# Patient Record
Sex: Female | Born: 1980 | Race: Black or African American | Hispanic: No | Marital: Single | Smoking: Former smoker
Health system: Southern US, Community
[De-identification: ages and names within clinical notes are randomized; demographics above are authoritative.]

## PROBLEM LIST (undated history)

## (undated) ENCOUNTER — Inpatient Hospital Stay (HOSPITAL_COMMUNITY): Payer: Self-pay

## (undated) DIAGNOSIS — R0602 Shortness of breath: Secondary | ICD-10-CM

## (undated) DIAGNOSIS — R7303 Prediabetes: Secondary | ICD-10-CM

## (undated) DIAGNOSIS — F419 Anxiety disorder, unspecified: Secondary | ICD-10-CM

## (undated) DIAGNOSIS — E669 Obesity, unspecified: Secondary | ICD-10-CM

## (undated) DIAGNOSIS — D649 Anemia, unspecified: Secondary | ICD-10-CM

## (undated) DIAGNOSIS — Z789 Other specified health status: Secondary | ICD-10-CM

## (undated) DIAGNOSIS — I10 Essential (primary) hypertension: Secondary | ICD-10-CM

## (undated) DIAGNOSIS — M1990 Unspecified osteoarthritis, unspecified site: Secondary | ICD-10-CM

## (undated) DIAGNOSIS — R600 Localized edema: Secondary | ICD-10-CM

## (undated) DIAGNOSIS — F32A Depression, unspecified: Secondary | ICD-10-CM

## (undated) DIAGNOSIS — G4730 Sleep apnea, unspecified: Secondary | ICD-10-CM

## (undated) DIAGNOSIS — R0600 Dyspnea, unspecified: Secondary | ICD-10-CM

## (undated) HISTORY — DX: Prediabetes: R73.03

## (undated) HISTORY — DX: Sleep apnea, unspecified: G47.30

## (undated) HISTORY — DX: Dyspnea, unspecified: R06.00

## (undated) HISTORY — DX: Anemia, unspecified: D64.9

## (undated) HISTORY — DX: Unspecified osteoarthritis, unspecified site: M19.90

## (undated) HISTORY — DX: Shortness of breath: R06.02

## (undated) HISTORY — DX: Localized edema: R60.0

## (undated) HISTORY — DX: Anxiety disorder, unspecified: F41.9

## (undated) HISTORY — DX: Depression, unspecified: F32.A

## (undated) HISTORY — DX: Obesity, unspecified: E66.9

---
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
## 2000-01-16 ENCOUNTER — Inpatient Hospital Stay (HOSPITAL_COMMUNITY): Admission: AD | Admit: 2000-01-16 | Discharge: 2000-01-16 | Payer: Self-pay | Admitting: *Deleted

## 2000-03-22 ENCOUNTER — Inpatient Hospital Stay (HOSPITAL_COMMUNITY): Admission: AD | Admit: 2000-03-22 | Discharge: 2000-03-22 | Payer: Self-pay | Admitting: Obstetrics & Gynecology

## 2000-03-23 ENCOUNTER — Inpatient Hospital Stay (HOSPITAL_COMMUNITY): Admission: AD | Admit: 2000-03-23 | Discharge: 2000-03-28 | Payer: Self-pay | Admitting: Obstetrics & Gynecology

## 2000-03-23 ENCOUNTER — Inpatient Hospital Stay (HOSPITAL_COMMUNITY): Admission: AD | Admit: 2000-03-23 | Discharge: 2000-03-23 | Payer: Self-pay | Admitting: *Deleted

## 2001-04-16 ENCOUNTER — Emergency Department (HOSPITAL_COMMUNITY): Admission: EM | Admit: 2001-04-16 | Discharge: 2001-04-16 | Payer: Self-pay

## 2001-04-24 ENCOUNTER — Encounter: Payer: Self-pay | Admitting: Emergency Medicine

## 2001-04-24 ENCOUNTER — Emergency Department (HOSPITAL_COMMUNITY): Admission: EM | Admit: 2001-04-24 | Discharge: 2001-04-24 | Payer: Self-pay | Admitting: Emergency Medicine

## 2001-07-27 ENCOUNTER — Emergency Department (HOSPITAL_COMMUNITY): Admission: EM | Admit: 2001-07-27 | Discharge: 2001-07-27 | Payer: Self-pay | Admitting: Emergency Medicine

## 2004-06-30 ENCOUNTER — Ambulatory Visit (HOSPITAL_COMMUNITY): Admission: RE | Admit: 2004-06-30 | Discharge: 2004-06-30 | Payer: Self-pay | Admitting: *Deleted

## 2004-11-15 ENCOUNTER — Encounter (INDEPENDENT_AMBULATORY_CARE_PROVIDER_SITE_OTHER): Payer: Self-pay | Admitting: *Deleted

## 2004-11-15 ENCOUNTER — Ambulatory Visit: Payer: Self-pay | Admitting: Obstetrics and Gynecology

## 2004-11-15 ENCOUNTER — Inpatient Hospital Stay (HOSPITAL_COMMUNITY): Admission: AD | Admit: 2004-11-15 | Discharge: 2004-11-18 | Payer: Self-pay | Admitting: *Deleted

## 2004-11-19 ENCOUNTER — Ambulatory Visit: Admission: RE | Admit: 2004-11-19 | Discharge: 2004-11-19 | Payer: Self-pay | Admitting: *Deleted

## 2007-01-17 ENCOUNTER — Emergency Department (HOSPITAL_COMMUNITY): Admission: EM | Admit: 2007-01-17 | Discharge: 2007-01-17 | Payer: Self-pay | Admitting: Emergency Medicine

## 2009-07-06 ENCOUNTER — Emergency Department (HOSPITAL_COMMUNITY): Admission: EM | Admit: 2009-07-06 | Discharge: 2009-07-06 | Payer: Self-pay | Admitting: Family Medicine

## 2010-10-14 NOTE — Discharge Summary (Signed)
Jill Stevenson, Jill Stevenson                ACCOUNT NO.:  [id number]   MEDICAL RECORD NO.:  [id number]          PATIENT TYPE:  INP   LOCATION:  9135                          FACILITY:  [facility]   PHYSICIAN:  Bonnita Hollow, M.D.DATE OF BIRTH:  1980-08-07   DATE OF ADMISSION:  11/15/2004  DATE OF DISCHARGE:  11/18/2004                                 DISCHARGE SUMMARY   ADMISSION DIAGNOSES:  1.  G3, P23-64-64-48 30 year old female with abdominal pain secondary to      contractions.  2.  History of C section x1.   DISCHARGE DIAGNOSES:  A 30 year old G3, P2-0-1-2.   DISCHARGE MEDICATIONS:  1.  Motrin 600 mg q.6-8h. p.r.n. pain.  2.  Percocet 5/325 mg 1-2 tablets q.6h. p.r.n. pain.  3.  Colace 100 mg p.o. daily.  4.  Prenatal vitamins one tablet p.o. daily.  5.  Depo-Provera 150 mg IM x1 before discharge.   HOSPITAL COURSE:  Ms. Bacha is a 30 year old G3, P1-0-1-1 who presented to  the MAU on November 15, 2004, having uterine contractions regularly with  moderate abdominal pain associated.  The patient requested repeat cesarean  section.  The patient was taken back to OR for low transverse cesarean  section.  Infant suction with __________ suction.  Placenta manually  removed.  Estimated blood loss 500 ml.  Postoperative condition  satisfactory.  Patient did receive final for anesthesia.  Baby and infant  stable postoperatively.  Rest of patient's hospital admission was  uneventful.  The patient remained afebrile.  Vital signs stable.  Pain  controlled well with Motrin and Percocet p.r.n.  Patient tolerated diet.  Good bowel sounds.  On discharge date, the patient was sent home in stable  condition.   DISCHARGE LABORATORIES:  Hemoglobin 11.8, blood type A positive, rubella  immune.   FOLLOWUP:  The patient will follow up at [facility] six weeks after  delivery date.        VRE/MEDQ  D:  11/18/2004  T:  11/18/2004  Job:  [id number]

## 2010-10-14 NOTE — Op Note (Signed)
NAMECARLEY, Jill Stevenson                ACCOUNT NO.:  [id number]   MEDICAL RECORD NO.:  [id number]          PATIENT TYPE:  INP   LOCATION:  9135                          FACILITY:  [facility]   PHYSICIAN:  Phil D. Okey Dupre, M.D.     DATE OF BIRTH:  August 09, 1980   DATE OF PROCEDURE:  11/15/2004  DATE OF DISCHARGE:                                 OPERATIVE REPORT   PROCEDURE:  Low transverse cesarean section.   PREOPERATIVE DIAGNOSIS:  Repeat cesarean section and labor.   POSTOPERATIVE DIAGNOSIS:  Repeat cesarean section and labor.   SURGEON:  Dr. Okey Dupre   FIRST ASSISTANT:  Dr. Corky Sox   ESTIMATED BLOOD LOSS:  500 mL.   ANESTHESIA:  Spinal.   POSTOPERATIVE CONDITION:  Satisfactory.   SPECIMENS TO PATHOLOGY:  Placenta.   OPERATIVE FINDINGS:  Baby girl with cord around the shoulder and leg and a  moderate amount of meconium in the amnionic sac.   Procedure went as follows:  Under satisfactory spinal anesthesia, the  patient in dorsal supine position, Foley catheter in urinary bladder, the  abdomen was prepped and draped in the usual sterile manner.  The abdomen was  entered through a Pfannenstiel incision, going through previous surgical  scar that was situated just above the crease in the abdominal apron, i.e.,  about 5 cm above the symphysis pubis.  The abdomen was entered by layers,  entering into the peritoneal cavity, into the visceral peritoneum, and the  anterior surface of the uterus opened transversely to the bladder pushed  away below uterine segment, was entered by sharp and blunt dissection and  from an LOT presentation, the baby was easily delivered.  After the head was  delivered, it was suctioned with DeLee suction.  Minimal amount of return  was obtained, cord doubly clamped, divided, baby handed to pediatrician.  Samples of blood taken from the cord for analysis.  Placenta manually  removed, the uterus explored and closed with a continuous running locked 0  Vicryl in an  atraumatic needle.  Three figure-of-eight sutures were placed  along the suture line in areas where there was still some oozing.  The areas  were observed for bleeding.  None was noted.  The ovaries and tubes were  found to be within normal limits.  The area was irrigated.  Tape,  instrument, sponge, and needle count were reported correct at this time, and  the fascia was closed with continuous running 0 Vicryl in an atraumatic  needle.  Subcutaneous bleeding was controlled with hot cautery.  The skin  edge was approximated with  skin staples, and tape, instrument, sponge, and needle count were reported  once again as correct, and the patient was in satisfactory condition,  transferred to the recovery room with a Foley catheter draining blood-  stained urine at the end of the procedure.       PDR/MEDQ  D:  11/15/2004  T:  11/15/2004  Job:  [id number]

## 2010-10-14 NOTE — Discharge Summary (Signed)
[facility]  Patient:    Jill Stevenson, Jill Stevenson                       MRN: [id number] Adm. Date:  [id number] Disc. Date: [id number] Attending:  Michaelle Copas Dictator:   Andrey Spearman, M.D.                           Discharge Summary  DISCHARGE DIAGNOSES:          1. Status post low transverse cesarean section                                  for failure to descend.                               2. Endometritis.  DISCHARGE MEDICATIONS:        1. Augmentin 875 mg p.o. b.i.d. x 6 days.                               2. Tylox one to two q.4-6h. p.r.n.                               3. Ibuprofen 600 mg q.6h. p.r.n.                               4. Prenatal vitamins one p.o. q.d.                               5. Ortho-Cyclen one p.o. q.d. beginning on                                  April 08, 2000.  BRIEF ADMISSION HISTORY AND PHYSICAL:                 This patient is a 30 year old, G1, P1-0-0-1, admitted at 40-0/7 weeks in active labor with a cervical exam at 100% and 0 station with a bulging bag of water.  The patients prenatal labs were significant for group B streptococcus.  The patients blood type is A+. Antibody negative.  She is rubella immune.  Hepatitis B negative.  The patients contractions on admission were approximately every two to four minutes.  The babys heart rate was in the 130s with positive acceleration. She was then admitted in active labor.  HOSPITAL COURSE: #1 - STATUS POST LOW TRANSVERSE CESAREAN SECTION:  The patient was admitted to labor and delivery.  She went into AROM with thick meconium fluid.  An IUPC and FFC were placed with amnioinfusion started.  At that time, the patient had a maternal temperature to 103 degrees and the fetal heart rates were in the 160s to 170s.  The patient was started on ampicillin at that time for the fever and she was given Tylenol.  The patient continued in active labor and eventually was  found to undergo failure to descend.  The patients station never progressed beyond approximately 0 station.  Therefore, she was taken for a low  transverse cesarean section.  For full dictation, please see the operative note.  A viable female was delivered with Apgars of 8 and 9.  The cord pH was 7.26.  Postoperatively, the patient underwent routine postoperative care.  Her diet was advanced.  Upon discharge, she was ambulating well, and tolerating a full diet.  The staples were removed.  The incision was clean, dry, and intact.  The fundus was firm.  She was given postoperative incisional instructions.  #2 - STATUS POST ENDOMETRITIS:  The patient received ampicillin as stated in active labor.  However, on March 25, 2000, the patient was found to have a temperature of 100.9 degrees.  The patient had a temperature maximum of 101.2 degrees on March 27, 2010.  Therefore, the patient received Unasyn from March 25, 2000, until March 28, 2000, for suspected endometritis. The patient did have associated abdominal pain during this time, which improved throughout the hospitalization, although on discharge she still was slightly tender to palpation over the fundus.  The patient was afebrile for 36 hours prior to discharge.  She will be discharged home with Augmentin 875 mg b.i.d. to take for six days.  She was instructed that if she has recurrence of fevers that she needs to come back to the maternity admissions unit as soon as possible.  She was discharged home in stable condition. DD:  03/28/00 TD:  03/28/00 Job: [id number]

## 2010-10-14 NOTE — Op Note (Signed)
[facility]  Patient:    Jill Stevenson, Jill Stevenson                       MRN: [id number] Proc. Date: 03/24/00 Adm. Date:  [id number] Attending:  Antionette Char                           Operative Report  PREOPERATIVE DIAGNOSIS:       Arrest of active phase of labor with failure                               to descend.  POSTOPERATIVE DIAGNOSIS:      Arrest of active phase of labor with failure                               to descend.  OPERATION:                    Low transverse cesarean section.  OPERATOR:                     Conni Elliot, M.D., Kevin Fenton, M.D.  FINDINGS:                     An 8 pound 10 ounce female, with Apgars of 8 and 9, with cord pH of 7.26.  DESCRIPTION OF PROCEDURE:     Under the appropriate conditions, the lumbar epidural anesthetic, the operative level, with the patient supine in the left lateral tilt position receiving oxygen, the abdomen was prepped and draped in a sterile fashion.  A low transverse Pfannenstiel incision was made, and the incision made through the skin and subcutaneous fascia.  The rectus muscle separated in the midline.  The peritoneal cavity was entered.  A low transverse uterine incision was made basilar from vertex presentation.  The baby was DeLee suctioned prior to the delivery of the chest.  The remainder of the body was delivered.  The cord was doubly clamped and cut, and the baby handed to the neonatologist in attendance.  The placenta was delivered spontaneously.  The uterus, bladder flap, anterior peritoneum, fascia, subcutaneous tissue, and skin were closed in the usual fashion.  The estimated blood loss was approximately 800 cc.  The instrument counts were correct. DD:  03/24/00 TD:  03/24/00 Job: [id number]

## 2012-06-19 ENCOUNTER — Emergency Department (HOSPITAL_COMMUNITY)
Admission: EM | Admit: 2012-06-19 | Discharge: 2012-06-19 | Disposition: A | Discharge status: Home / Self Care | Payer: Self-pay | Attending: Emergency Medicine | Admitting: Emergency Medicine

## 2012-06-19 ENCOUNTER — Encounter (HOSPITAL_COMMUNITY): Payer: Self-pay | Admitting: Emergency Medicine

## 2012-06-19 DIAGNOSIS — S0180XA Unspecified open wound of other part of head, initial encounter: Secondary | ICD-10-CM | POA: Insufficient documentation

## 2012-06-19 DIAGNOSIS — IMO0002 Reserved for concepts with insufficient information to code with codable children: Secondary | ICD-10-CM | POA: Insufficient documentation

## 2012-06-19 DIAGNOSIS — T07XXXA Unspecified multiple injuries, initial encounter: Secondary | ICD-10-CM

## 2012-06-19 DIAGNOSIS — Z23 Encounter for immunization: Secondary | ICD-10-CM | POA: Insufficient documentation

## 2012-06-19 MED ORDER — OXYCODONE-ACETAMINOPHEN 5-325 MG PO TABS
1.0000 | ORAL_TABLET | Freq: Once | ORAL | Status: AC
  Administered 2012-06-19: 1 via ORAL
  Filled 2012-06-19: qty 1

## 2012-06-19 MED ORDER — IBUPROFEN 400 MG PO TABS
800.0000 mg | ORAL_TABLET | Freq: Once | ORAL | Status: AC
  Administered 2012-06-19: 800 mg via ORAL
  Filled 2012-06-19: qty 2

## 2012-06-19 MED ORDER — PERCOCET 5-325 MG PO TABS: 1.0000 | ORAL_TABLET | Freq: Four times a day (QID) | ORAL | Status: DC | PRN

## 2012-06-19 MED ORDER — IBUPROFEN 800 MG PO TABS: 800.0000 mg | ORAL_TABLET | Freq: Three times a day (TID) | ORAL | Status: DC

## 2012-06-19 MED ORDER — TETANUS-DIPHTH-ACELL PERTUSSIS 5-2.5-18.5 LF-MCG/0.5 IM SUSP
0.5000 mL | Freq: Once | INTRAMUSCULAR | Status: AC
  Administered 2012-06-19: 0.5 mL via INTRAMUSCULAR
  Filled 2012-06-19: qty 0.5

## 2012-06-19 NOTE — ED Provider Notes (Signed)
Medical screening examination/treatment/procedure(s) were performed by non-physician practitioner and as supervising physician I was immediately available for consultation/collaboration.  Juliet Rude. Rubin Payor, MD 06/19/12 1534
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
## 2012-06-19 NOTE — ED Notes (Signed)
No swelling at site of lac. Pt able to open and close jaw without difficulty.

## 2014-06-24 ENCOUNTER — Emergency Department (HOSPITAL_COMMUNITY)
Admission: EM | Admit: 2014-06-24 | Discharge: 2014-06-24 | Disposition: A | Discharge status: Home / Self Care | Payer: Medicaid Other | Attending: Emergency Medicine | Admitting: Emergency Medicine

## 2014-06-24 ENCOUNTER — Encounter (HOSPITAL_COMMUNITY): Payer: Self-pay | Admitting: Emergency Medicine

## 2014-06-24 ENCOUNTER — Emergency Department (HOSPITAL_COMMUNITY): Payer: Medicaid Other

## 2014-06-24 DIAGNOSIS — Y998 Other external cause status: Secondary | ICD-10-CM | POA: Diagnosis not present

## 2014-06-24 DIAGNOSIS — S6992XA Unspecified injury of left wrist, hand and finger(s), initial encounter: Secondary | ICD-10-CM

## 2014-06-24 DIAGNOSIS — Y9389 Activity, other specified: Secondary | ICD-10-CM | POA: Insufficient documentation

## 2014-06-24 DIAGNOSIS — S61213A Laceration without foreign body of left middle finger without damage to nail, initial encounter: Secondary | ICD-10-CM | POA: Insufficient documentation

## 2014-06-24 DIAGNOSIS — S61215A Laceration without foreign body of left ring finger without damage to nail, initial encounter: Secondary | ICD-10-CM | POA: Insufficient documentation

## 2014-06-24 DIAGNOSIS — Y288XXA Contact with other sharp object, undetermined intent, initial encounter: Secondary | ICD-10-CM | POA: Diagnosis not present

## 2014-06-24 DIAGNOSIS — S62663A Nondisplaced fracture of distal phalanx of left middle finger, initial encounter for closed fracture: Secondary | ICD-10-CM | POA: Insufficient documentation

## 2014-06-24 DIAGNOSIS — S61211A Laceration without foreign body of left index finger without damage to nail, initial encounter: Secondary | ICD-10-CM | POA: Diagnosis not present

## 2014-06-24 DIAGNOSIS — Y9289 Other specified places as the place of occurrence of the external cause: Secondary | ICD-10-CM | POA: Diagnosis not present

## 2014-06-24 DIAGNOSIS — T07XXXA Unspecified multiple injuries, initial encounter: Secondary | ICD-10-CM

## 2014-06-24 DIAGNOSIS — S62609B Fracture of unspecified phalanx of unspecified finger, initial encounter for open fracture: Secondary | ICD-10-CM

## 2014-06-24 DIAGNOSIS — Z23 Encounter for immunization: Secondary | ICD-10-CM | POA: Diagnosis not present

## 2014-06-24 MED ORDER — CEPHALEXIN 500 MG PO CAPS: 500.0000 mg | ORAL_CAPSULE | Freq: Four times a day (QID) | ORAL | Status: DC

## 2014-06-24 MED ORDER — HYDROCODONE-ACETAMINOPHEN 5-325 MG PO TABS: 1.0000 | ORAL_TABLET | ORAL | Status: DC | PRN

## 2014-06-24 MED ORDER — HYDROCODONE-ACETAMINOPHEN 5-325 MG PO TABS
2.0000 | ORAL_TABLET | Freq: Once | ORAL | Status: AC
  Administered 2014-06-24: 2 via ORAL
  Filled 2014-06-24: qty 2

## 2014-06-24 MED ORDER — LIDOCAINE HCL (PF) 1 % IJ SOLN
5.0000 mL | Freq: Once | INTRAMUSCULAR | Status: AC
  Administered 2014-06-24: 5 mL
  Filled 2014-06-24: qty 5

## 2014-06-24 MED ORDER — TETANUS-DIPHTH-ACELL PERTUSSIS 5-2.5-18.5 LF-MCG/0.5 IM SUSP
0.5000 mL | Freq: Once | INTRAMUSCULAR | Status: AC
  Administered 2014-06-24: 0.5 mL via INTRAMUSCULAR
  Filled 2014-06-24: qty 0.5

## 2014-06-24 NOTE — ED Notes (Signed)
Ortho at bedside to place splint.

## 2014-06-24 NOTE — Discharge Instructions (Signed)
Take the prescribed medication as directed. Follow-up with hand specialist, Dr. Caralyn Guile-- call to schedule appt. Follow-up for suture removal in 5-7 days. Return to the ED for new or worsening symptoms.
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
## 2014-06-24 NOTE — ED Notes (Signed)
Patient states that she was cutting onions at home this morning and cut across her fingers.   Patient has lacerations across L hand fingers minus the thumb.  Bleeding controlled at this time.   Patient has limited movement in fingers.   Good cap refill at this time.

## 2014-06-24 NOTE — ED Notes (Signed)
Ortho tech in to apply splint.

## 2014-06-24 NOTE — Progress Notes (Signed)
Orthopedic Tech Progress Note Patient Details:  Jill Stevenson [birth year] [id number]  Ortho Devices Type of Ortho Device: Ace wrap, Volar splint Ortho Device/Splint Location: lue Ortho Device/Splint Interventions: Application   Jill Stevenson 06/24/2014, 1:51 PM

## 2014-10-14 ENCOUNTER — Ambulatory Visit: Payer: Medicaid Other | Attending: Orthopedic Surgery | Admitting: Occupational Therapy

## 2014-10-14 DIAGNOSIS — M25642 Stiffness of left hand, not elsewhere classified: Secondary | ICD-10-CM | POA: Insufficient documentation

## 2014-10-14 DIAGNOSIS — R609 Edema, unspecified: Secondary | ICD-10-CM | POA: Insufficient documentation

## 2014-10-14 DIAGNOSIS — R29898 Other symptoms and signs involving the musculoskeletal system: Secondary | ICD-10-CM

## 2014-10-14 DIAGNOSIS — M79642 Pain in left hand: Secondary | ICD-10-CM | POA: Insufficient documentation

## 2014-10-14 DIAGNOSIS — M6289 Other specified disorders of muscle: Secondary | ICD-10-CM | POA: Insufficient documentation

## 2014-10-14 NOTE — Therapy (Signed)
[facility] [address] [place], [place], [address] Phone: [phone]   Fax:  [phone]  Occupational Therapy Evaluation  Patient Details  Name: Jill Stevenson MRN: [id number] Date of Birth: [birth year] Referring Provider:  Iran Planas, MD  Encounter Date: 10/14/2014    No past medical history on file.  No past surgical history on file.  There were no vitals filed for this visit.  Visit Diagnosis:  Pain in joint, hand, left  Stiffness of hand joint, left  Edema  Weakness of left hand      Subjective Assessment - 10/14/14 1407    Currently in Pain? Yes   Pain Score 8    Pain Location Finger (Comment which one)   Pain Orientation Left   Pain Descriptors / Indicators Aching;Tingling   Pain Type Chronic pain   Pain Onset More than a month ago   Aggravating Factors  arm hanging down   Pain Relieving Factors medicine, heat   Effect of Pain on Daily Activities limits functional use   Multiple Pain Sites No           [facility] OT Assessment - 10/14/14 0001    Assessment   Diagnosis Zone I extensor tendon repairs left long and ring fingers in January 2016, second surgery to remove pins April 2016   Onset Date --  January 2016 inital surgery   Assessment Pt s/p repair of zone I extensor tendon repairs for left long and ring fingers presents with decreased A/ROM, strenght and pain which impedes performance of ADLS/IADLS. Pt can benefit from skilled occupational therapy to address these deficits.   Prior Therapy no   Precautions   Precautions Other (comment)  no heavy lifting   Precaution Comments no heavy lifting, cleared for A/ROM   Home  Environment   Lives With Son   Prior Function   Level of Independence Independent with basic ADLs;Independent with homemaking with ambulation   Vocation Full time employment   Vocation Requirements CNA   ADL   Eating/Feeding Needs assist with cutting food   difficulty opening containers   Grooming Independent  unable to style hair   Upper Body Bathing Modified independent   Lower Body Bathing Modified independent   Upper Body Dressing Independent  wears sports bras, due to difficulty with fasteners   Lower Body Dressing Needs assist for fasteners;Minimal assistance  needs assist with socks   Toilet Tranfer Independent   Tub/Shower Transfer Independent   ADL comments Pt uses LUE as an active assist 50% of the time for ADLs/ IADLS   Written Expression   Dominant Hand Right   Sensation   Light Touch Impaired by gross assessment  ring and middle fingers   Additional Comments opposes all digits with thumb   Edema   Edema mild for ring and middle fingers   ROM / Strength   AROM / PROM / Strength AROM  strength not tested as pt only cleared for A/ROM   AROM   Overall AROM  Deficits   Overall AROM Comments Pt demonstrates limited composite and individual finger flexion for middle and ring fingers of LUE.   Right Hand AROM   R Index  MCP 0-90 --   R Index PIP 0-100 --   R Index DIP 0-70 --   R Long  MCP 0-90 --   R Long PIP 0-100 --   R Long DIP 0-70 --   R Ring  MCP 0-90 --   R  Ring PIP 0-100 --   R Ring DIP 0-70 --   R Little  MCP 0-90 --   R Little PIP 0-100 --   R Little DIP 0-70 --   Left Hand AROM   L Index  MCP 0-90 70 Degrees   L Index PIP 0-100 70 Degrees   L Index DIP 0-70 50 Degrees   L Long  MCP 0-90 80 Degrees   L Long PIP 0-100 65 Degrees   L Long DIP 0-70 20 Degrees   L Ring  MCP 0-90 80 Degrees   L Ring PIP 0-100 60 Degrees   L Ring DIP 0-70 10 Degrees   L Little  MCP 0-90 85 Degrees   L Little PIP 0-100 75 Degrees   L Little DIP 0-70 45 Degrees   Hand Function   Right Hand Gross Grasp Functional   Left Hand Gross Grasp Impaired  grossly 75% composite finger flexion    Left Hand Grip (lbs) not tested due to precuaitions, pt cleared for A/ROM       Treatment: Pt was instructed in initial HEP(see pt  instructions). She returned demonstration.                       OT Long Term Goals - 10/14/14 1443    OT LONG TERM GOAL #1   Title I with HEP   Baseline dependent on eval   Time 8   Period Weeks   Status New   OT LONG TERM GOAL #2   Title Pt will demonstrate at least 90% composite finger flexion in prep for functional use.   Baseline grossly 75% composite finger flexion   Time 8   Period Weeks   Status New   OT LONG TERM GOAL #3   Title Pt will demonstrate LUE grip strength of 30 lbs for increased functional use.   Baseline not tested due to precuations and pain   Time 8   Period Weeks   Status New   OT LONG TERM GOAL #4   Title Pt will report ability to use LUE as an active assist at least 90% of the time with pain less than or equal to 3/10.   Baseline Pt reports she uses hand no more than 50% of the time for light activities with pain 8/10 on eval   Time 8   Period Weeks   Status New               Plan - 10/14/14 1642    Clinical Impression Statement Pt s/p zone I extensor tendon injury(06/24/14) and s/p extensor tendon repair of left long and ring fingers( CPT #26418) in January 2016, and a second surgery April 2016 to remove pins presents with decreased A/ROM, decreased strength and increased pain which impedes performance of ADLS/IADLS.   Pt will benefit from skilled therapeutic intervention in order to improve on the following deficits (Retired) Decreased coordination;Decreased range of motion;Impaired flexibility;Impaired sensation;Increased edema;Decreased activity tolerance;Impaired UE functional use;Pain;Decreased scar mobility;Decreased strength   Rehab Potential Good   Clinical Impairments Affecting Rehab Potential pain, decreased A/ROM, decreased strength decreased LUE functional use.   OT Frequency Other (comment)  eval + 3 visits due to medicaid restrictions   OT Duration 8 weeks   OT Treatment/Interventions Self-care/ADL training;Moist  Heat;Fluidtherapy;DME and/or AE instruction;Splinting;Patient/family education;Therapeutic exercises;Therapeutic exercise;Therapeutic activities;Scar mobilization;Cryotherapy;Iontophoresis;Neuromuscular education;Passive range of motion;Parrafin;Electrical Stimulation   Plan modalities, progress HEP.   OT Home Exercise Plan A/ROM exercisies 10/14/14  Consulted and Agree with Plan of Care Patient        Problem List There are no active problems to display for this patient.   RINE,KATHRYN 10/14/2014, 5:10 PM Theone Murdoch, OTR/L [phone] Phone: [phone] 5:10 PM 10/14/2014 [facility] [address] [place], [place], [address] Phone: [phone]   Fax:  [phone]

## 2014-10-14 NOTE — Patient Instructions (Addendum)
Bend all fingers of your left hand at knuckle joints, then straighten 10 reps 4-6 x day (Roof)  Bend all digits of your left hand at the middle knuckle 10 10 reps 4-6x day.  Bend your middle finger and ring finger at the tip joint, 10 reps 4-6 x day.  Then try to make a fist 10 reps 4-6x day  You may want to use heat for 8-10 mins prior to exercise to decrease stiffness, monitor skin closely to prevent burns.

## 2014-11-03 ENCOUNTER — Ambulatory Visit: Payer: Medicaid Other | Attending: Orthopedic Surgery | Admitting: Occupational Therapy

## 2014-11-10 ENCOUNTER — Encounter: Payer: Medicaid Other | Admitting: Occupational Therapy

## 2014-11-19 ENCOUNTER — Ambulatory Visit: Payer: Medicaid Other | Admitting: Occupational Therapy

## 2014-11-26 ENCOUNTER — Ambulatory Visit: Payer: Medicaid Other | Admitting: Occupational Therapy

## 2015-04-11 NOTE — Therapy (Signed)
  Patient Details  Name: Jill Stevenson MRN: [id number] Date of Birth: [birth year] Referring Provider:  No ref. provider found  Encounter Date: 06/24/2014  OCCUPATIONAL THERAPY DISCHARGE SUMMARY  Visits from Start of Care: 1 Current functional level related to goals / functional outcomes: Unknown, pt did not return following eval.    Remaining deficits: See eval    Education / Equipment: Pt was instructed in HEP, she verbalized understanding. Goals were not met as pt did not return. Plan: Patient agrees to discharge.  Patient goals were not met. Patient is being discharged due to the patient's request.  ?????     RINE,KATHRYN 04/11/2015, 6:23 PM Theone Murdoch, OTR/L [phone] Phone: [phone] 6:23 PM 04/11/2015 [place]

## 2015-06-08 ENCOUNTER — Encounter: Payer: Self-pay | Admitting: Occupational Therapy

## 2015-06-08 NOTE — Therapy (Signed)
[facility] [address] [place], [place], [address] Phone: [phone]   Fax:  [phone]  Patient Details  Name: MATIA ZELADA MRN: [id number] Date of Birth: 06-06-1980 Referring Provider:  No ref. provider found  Encounter Date: 06/08/2015  OCCUPATIONAL THERAPY DISCHARGE SUMMARY  Visits from Start of Care: 1  Current functional level related to goals / functional outcomes: Pt did not meet goals as she did not return following initial visit.   Remaining deficits: See eval   Education / Equipment: Education was not completed as pt was seen for eval only.  Plan: Patient agrees to discharge.  Patient goals were not met. Patient is being discharged due to not returning since the last visit.  ?????     RINE,KATHRYN 06/08/2015, 11:32 AM  [facility] [address] [place], [place], [address] Phone: [phone]   Fax:  [phone]

## 2015-08-03 ENCOUNTER — Encounter (HOSPITAL_COMMUNITY): Payer: Self-pay | Admitting: *Deleted

## 2015-08-03 ENCOUNTER — Emergency Department (HOSPITAL_COMMUNITY)
Admission: EM | Admit: 2015-08-03 | Discharge: 2015-08-03 | Disposition: A | Discharge status: Home / Self Care | Payer: Medicaid Other | Attending: Physician Assistant | Admitting: Physician Assistant

## 2015-08-03 DIAGNOSIS — Z791 Long term (current) use of non-steroidal anti-inflammatories (NSAID): Secondary | ICD-10-CM | POA: Diagnosis not present

## 2015-08-03 DIAGNOSIS — M25471 Effusion, right ankle: Secondary | ICD-10-CM | POA: Diagnosis not present

## 2015-08-03 DIAGNOSIS — Z792 Long term (current) use of antibiotics: Secondary | ICD-10-CM | POA: Diagnosis not present

## 2015-08-03 DIAGNOSIS — F17210 Nicotine dependence, cigarettes, uncomplicated: Secondary | ICD-10-CM | POA: Diagnosis not present

## 2015-08-03 MED ORDER — METHOCARBAMOL 500 MG PO TABS
1000.0000 mg | ORAL_TABLET | Freq: Once | ORAL | Status: AC
  Administered 2015-08-03: 1000 mg via ORAL
  Filled 2015-08-03: qty 2

## 2015-08-03 MED ORDER — IBUPROFEN 400 MG PO TABS
800.0000 mg | ORAL_TABLET | Freq: Once | ORAL | Status: AC
  Administered 2015-08-03: 800 mg via ORAL
  Filled 2015-08-03: qty 2

## 2015-08-03 MED ORDER — NAPROXEN 500 MG PO TABS: 500.0000 mg | ORAL_TABLET | Freq: Two times a day (BID) | ORAL | Status: DC

## 2015-08-03 MED ORDER — METHOCARBAMOL 500 MG PO TABS: 500.0000 mg | ORAL_TABLET | Freq: Two times a day (BID) | ORAL | Status: DC

## 2015-08-03 NOTE — Progress Notes (Signed)
Orthopedic Tech Progress Note Patient Details:  Jill Stevenson [birth year] [id number]  Ortho Devices Type of Ortho Device: Ace wrap Ortho Device/Splint Location: ace wrap applied to r ankle Ortho Device/Splint Interventions: Application, Ordered   Karolee Stamps 08/03/2015, 9:15 PM

## 2015-08-03 NOTE — ED Provider Notes (Signed)
CSN: [id number]     Arrival date & time 08/03/15  2013 History  By signing my name below, I, Soijett Blue, attest that this documentation has been prepared under the direction and in the presence of S. Wendie Simmer, PA-C Electronically Signed: Soijett Blue, ED Scribe. 08/03/2015. 9:04 PM.    Chief Complaint  Patient presents with  . Joint Swelling      The history is provided by the patient. No language interpreter was used.    Jill Stevenson is a 35 y.o. female who presents to the Emergency Department complaining of constant, sharp, right ankle pain onset 3 days worsening this morning. Pt notes that her right ankle pain radiates to her right heel. Pt reports that she stands for extended periods of time while at work. Pt denies any injury/trauma to the right ankle. Pt is having associated symptoms of right ankle swelling. She notes that she has tried ice and epsom salt soaks without any medications for the relief of her symptoms. She denies color change, wound, rash, gait problem, fever, chills, and any other symptoms.    History reviewed. No pertinent past medical history. History reviewed. No pertinent past surgical history. No family history on file. Social History  Substance Use Topics  . Smoking status: Current Every Day Smoker -- 0.25 packs/day    Types: Cigarettes  . Smokeless tobacco: None  . Alcohol Use: Yes   OB History    No data available     Review of Systems  Musculoskeletal: Positive for joint swelling and arthralgias. Negative for gait problem.  Skin: Negative for color change, rash and wound.      Allergies  Review of patient's allergies indicates no known allergies.  Home Medications   Prior to Admission medications   Medication Sig Start Date End Date Taking? Authorizing Provider  cephALEXin (KEFLEX) 500 MG capsule Take 1 capsule (500 mg total) by mouth 4 (four) times daily. 06/24/14   Larene Pickett, PA-C  HYDROcodone-acetaminophen (NORCO/VICODIN) 5-325  MG per tablet Take 1 tablet by mouth every 4 (four) hours as needed. 06/24/14   Larene Pickett, PA-C  ibuprofen (ADVIL,MOTRIN) 800 MG tablet Take 1 tablet (800 mg total) by mouth 3 (three) times daily. 06/19/12   Lisette Paz, PA-C  PERCOCET 5-325 MG per tablet Take 1 tablet by mouth every 6 (six) hours as needed for pain. 06/19/12   Lisette Paz, PA-C   BP 168/98 mmHg  Pulse 96  Temp(Src) 99.2 F (37.3 C) (Oral)  Resp 18  SpO2 97%  LMP 06/29/2015 Physical Exam  Constitutional: She is oriented to person, place, and time. She appears well-developed and well-nourished. No distress.  HENT:  Head: Normocephalic and atraumatic.  Eyes: EOM are normal.  Neck: Neck supple.  Cardiovascular: Normal rate.   Pulmonary/Chest: Effort normal. No respiratory distress.  Musculoskeletal: Normal range of motion.       Right ankle: She exhibits swelling. She exhibits normal range of motion.  Very minimal swelling of right ankle. No surrounding erythema. Full ROM. NVI bilaterally LE.  Neurological: She is alert and oriented to person, place, and time.  Skin: Skin is warm and dry.  Psychiatric: She has a normal mood and affect. Her behavior is normal.  Nursing note and vitals reviewed.   ED Course  Procedures  DIAGNOSTIC STUDIES: Oxygen Saturation is 97% on RA, nl by my interpretation.    COORDINATION OF CARE: 8:54 PM Discussed treatment plan with pt at bedside which includes naprosyn Rx and  follow up with PCP and pt agreed to plan.   MDM   Final diagnoses:  Ankle swelling, right   Imaging not indicated, as patient denies trauma and there are no significant physical exam findings. Pain managed in ED. Pt advised to follow up with orthopedics if symptoms persist.. Patient given brace while in ED, conservative therapy recommended and discussed. Patient will be dc home & is agreeable with above plan.   [place] Lions, PA-C 08/05/15 2249  Courteney Julio Alm, MD 08/07/15 1046

## 2015-08-03 NOTE — ED Notes (Signed)
Ardelia Mems, PA at bedside at this time.

## 2015-08-03 NOTE — Discharge Instructions (Signed)
Ms. GWENNETH JARNAGIN,  Nice meeting you! Please follow-up with your primary care provider. Return to the emergency department if you develop fevers, chills, drainage, redness or warmth surrounding the ankle, chest pain, shortness of breath, increased swelling or pain, inability to move your ankle. Feel better soon!  S. Wendie Simmer, PA-C

## 2015-08-03 NOTE — ED Notes (Signed)
Pt c/o right ankle pain x 3 days. States it started swelling yesterday. States she stands for long periods of time at work.

## 2016-07-18 ENCOUNTER — Encounter (HOSPITAL_COMMUNITY): Payer: Self-pay | Admitting: Emergency Medicine

## 2016-07-18 ENCOUNTER — Emergency Department (HOSPITAL_COMMUNITY)
Admission: EM | Admit: 2016-07-18 | Discharge: 2016-07-19 | Disposition: A | Discharge status: Home / Self Care | Payer: Medicaid Other | Attending: Emergency Medicine | Admitting: Emergency Medicine

## 2016-07-18 ENCOUNTER — Encounter (HOSPITAL_COMMUNITY): Payer: Self-pay

## 2016-07-18 ENCOUNTER — Emergency Department (HOSPITAL_COMMUNITY)
Admission: EM | Admit: 2016-07-18 | Discharge: 2016-07-18 | Disposition: A | Discharge status: Home / Self Care | Payer: Medicaid Other | Attending: Dermatology | Admitting: Dermatology

## 2016-07-18 DIAGNOSIS — Y92239 Unspecified place in hospital as the place of occurrence of the external cause: Secondary | ICD-10-CM | POA: Diagnosis not present

## 2016-07-18 DIAGNOSIS — F17210 Nicotine dependence, cigarettes, uncomplicated: Secondary | ICD-10-CM | POA: Insufficient documentation

## 2016-07-18 DIAGNOSIS — Y93E5 Activity, floor mopping and cleaning: Secondary | ICD-10-CM | POA: Diagnosis not present

## 2016-07-18 DIAGNOSIS — Y9389 Activity, other specified: Secondary | ICD-10-CM | POA: Insufficient documentation

## 2016-07-18 DIAGNOSIS — X58XXXA Exposure to other specified factors, initial encounter: Secondary | ICD-10-CM | POA: Diagnosis not present

## 2016-07-18 DIAGNOSIS — W231XXA Caught, crushed, jammed, or pinched between stationary objects, initial encounter: Secondary | ICD-10-CM | POA: Insufficient documentation

## 2016-07-18 DIAGNOSIS — Y999 Unspecified external cause status: Secondary | ICD-10-CM | POA: Diagnosis not present

## 2016-07-18 DIAGNOSIS — Z5321 Procedure and treatment not carried out due to patient leaving prior to being seen by health care provider: Secondary | ICD-10-CM | POA: Insufficient documentation

## 2016-07-18 DIAGNOSIS — S61300A Unspecified open wound of right index finger with damage to nail, initial encounter: Secondary | ICD-10-CM | POA: Diagnosis not present

## 2016-07-18 DIAGNOSIS — Z79899 Other long term (current) drug therapy: Secondary | ICD-10-CM | POA: Diagnosis not present

## 2016-07-18 DIAGNOSIS — S6991XA Unspecified injury of right wrist, hand and finger(s), initial encounter: Secondary | ICD-10-CM | POA: Diagnosis present

## 2016-07-18 DIAGNOSIS — Y990 Civilian activity done for income or pay: Secondary | ICD-10-CM | POA: Diagnosis not present

## 2016-07-18 DIAGNOSIS — Y92816 Subway car as the place of occurrence of the external cause: Secondary | ICD-10-CM | POA: Insufficient documentation

## 2016-07-18 DIAGNOSIS — S61309A Unspecified open wound of unspecified finger with damage to nail, initial encounter: Secondary | ICD-10-CM

## 2016-07-18 NOTE — ED Notes (Signed)
[phone] Pt cell phone, pt going back to work, pt works at [employer]

## 2016-07-18 NOTE — ED Notes (Signed)
Pt did not want to wait any longer due to having to come back to work tomorrow. Pt states she will be back in tomorrow to be seen.

## 2016-07-18 NOTE — ED Triage Notes (Signed)
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

## 2016-07-18 NOTE — ED Triage Notes (Signed)
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 Right ringer fingernail got caught on thread of mop and tore fingernail.

## 2016-07-19 ENCOUNTER — Emergency Department (HOSPITAL_COMMUNITY): Payer: Medicaid Other

## 2016-07-19 MED ORDER — BACITRACIN ZINC 500 UNIT/GM EX OINT
TOPICAL_OINTMENT | Freq: Two times a day (BID) | CUTANEOUS | Status: DC
  Administered 2016-07-19: 1 via TOPICAL

## 2016-07-19 NOTE — ED Provider Notes (Signed)
[facility] DEPT Provider Note   CSN: [id number] Arrival date & time: 07/18/16  2120     History   Chief Complaint Chief Complaint  Patient presents with  . Finger Injury    HPI Jill Stevenson is a 36 y.o. female who presents to the ED with right ring finger nail injury. 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 She denies any other injuries. She Is up to date on tetanus.   HPI  History reviewed. No pertinent past medical history.  There are no active problems to display for this patient.   History reviewed. No pertinent surgical history.  OB History    No data available       Home Medications    Prior to Admission medications   Medication Sig Start Date End Date Taking? Authorizing Provider  cephALEXin (KEFLEX) 500 MG capsule Take 1 capsule (500 mg total) by mouth 4 (four) times daily. 06/24/14   Larene Pickett, PA-C  HYDROcodone-acetaminophen (NORCO/VICODIN) 5-325 MG per tablet Take 1 tablet by mouth every 4 (four) hours as needed. 06/24/14   Larene Pickett, PA-C  ibuprofen (ADVIL,MOTRIN) 800 MG tablet Take 1 tablet (800 mg total) by mouth 3 (three) times daily. 06/19/12   Lisette Paz, PA-C  methocarbamol (ROBAXIN) 500 MG tablet Take 1 tablet (500 mg total) by mouth 2 (two) times daily. 08/03/15   [address] Lions, PA-C  naproxen (NAPROSYN) 500 MG tablet Take 1 tablet (500 mg total) by mouth 2 (two) times daily. 08/03/15   Samantha Wendie Simmer, PA-C  PERCOCET 5-325 MG per tablet Take 1 tablet by mouth every 6 (six) hours as needed for pain. 06/19/12   Verl Dicker, PA-C    Family History History reviewed. No pertinent family history.  Social History Social History  Substance Use Topics  . Smoking status: Current Every Day Smoker    Packs/day: 0.25    Types: Cigarettes  . Smokeless tobacco: Never Used  . Alcohol use Yes     Allergies   Patient  has no known allergies.   Review of Systems Review of Systems  Constitutional: Negative for fever.  Gastrointestinal: Negative for nausea.  Skin: Positive for wound.       Right index finger injury     Physical Exam Updated Vital Signs BP 138/89   Pulse 84   Temp 98.2 F (36.8 C)   Resp 18   LMP 06/11/2016   SpO2 97%   Physical Exam  Constitutional: She is oriented to person, place, and time. She appears well-developed and well-nourished. No distress.  Eyes: EOM are normal.  Neck: Neck supple.  Cardiovascular: Normal rate.   Pulmonary/Chest: Effort normal.  Abdominal: Soft. There is no tenderness.  Musculoskeletal: Normal range of motion.       Hands: Right index finger with nail partially avulsed. Nail trimmed and finger cleaned. Bacitracin ointment, Dressing and splint.  Neurological: She is alert and oriented to person, place, and time. No cranial nerve deficit.  Skin: Skin is warm and dry.  Psychiatric: She has a normal mood and affect. Her behavior is normal.  Nursing note and vitals reviewed.    ED Treatments / Results  Labs (all labs ordered are listed, but only abnormal results are displayed)   Radiology Dg Finger Ring Right  Result Date: 07/19/2016 CLINICAL DATA:  Injury, tore off nail EXAM: RIGHT RING FINGER  2+V COMPARISON:  01/17/2007 FINDINGS: There is no evidence of fracture or dislocation. There is no evidence of arthropathy or other focal bone abnormality. Soft tissues are unremarkable. Old fracture deformity of the distal fifth metacarpal IMPRESSION: 1. No acute osseous abnormality. 2. Old fracture deformity of the distal fifth metacarpal. Electronically Signed   By: Donavan Foil M.D.   On: 07/19/2016 00:51    Procedures Procedures (including critical care time)  Medications Ordered in ED Medications - No data to display   Initial Impression / Assessment and Plan / ED Course  I have reviewed the triage vital signs and the nursing  notes.  Pertinent imaging results that were available during my care of the patient were reviewed by me and considered in my medical decision making (see chart for details).   Final Clinical Impressions(s) / ED Diagnoses  36 y.o. female with finger injury stable for d/c without signs of infection. Discussed with the patient clinical and x-ray findings and plan of care and all questioned fully answered. She will return if any problems arise.  Final diagnoses:  Nail avulsion, finger, initial encounter    New Prescriptions Discharge Medication List as of 07/19/2016  1:12 AM       Ashley Murrain, NP 07/20/16 [id number]    Everlene Balls, MD 07/20/16 2213

## 2016-07-19 NOTE — ED Notes (Signed)
Patient transported to X-ray 

## 2016-07-19 NOTE — ED Notes (Signed)
See EDP assessment 

## 2017-03-17 ENCOUNTER — Encounter (HOSPITAL_COMMUNITY): Payer: Self-pay | Admitting: Emergency Medicine

## 2017-03-17 ENCOUNTER — Ambulatory Visit (HOSPITAL_COMMUNITY)
Admission: EM | Admit: 2017-03-17 | Discharge: 2017-03-17 | Disposition: A | Discharge status: Home / Self Care | Payer: Medicaid Other | Attending: Family Medicine | Admitting: Family Medicine

## 2017-03-17 DIAGNOSIS — Z113 Encounter for screening for infections with a predominantly sexual mode of transmission: Secondary | ICD-10-CM | POA: Insufficient documentation

## 2017-03-17 DIAGNOSIS — Z3202 Encounter for pregnancy test, result negative: Secondary | ICD-10-CM | POA: Diagnosis not present

## 2017-03-17 DIAGNOSIS — F17210 Nicotine dependence, cigarettes, uncomplicated: Secondary | ICD-10-CM | POA: Insufficient documentation

## 2017-03-17 LAB — POCT PREGNANCY, URINE: PREG TEST UR: NEGATIVE

## 2017-03-17 NOTE — ED Notes (Signed)
Sent patient to the bathroom for a dirty urine.

## 2017-03-17 NOTE — ED Provider Notes (Signed)
  [facility]   [id number] 03/17/17 Arrival Time: 4098  ASSESSMENT & PLAN:  Today you were diagnosed with the following: 1. Screen for STD (sexually transmitted disease)    Will treat or not based on urine cytology results. She may f/u otherwise as needed.  Reviewed expectations re: course of current medical issues. Questions answered. Outlined signs and symptoms indicating need for more acute intervention. Patient verbalized understanding. After Visit Summary given.   SUBJECTIVE:  Jill Stevenson is a 36 y.o. female who requests STD screening. No current symptoms. No abdominal or pelvic discomfort. Currently sexually active with one female partner; occasional condom use.  No LMP recorded. Patient is not currently having periods (Reason: Irregular Periods).  ROS: As per HPI.  OBJECTIVE:  Vitals:   03/17/17 1502  BP: (!) 149/99  Pulse: 90  Resp: (!) 22  Temp: 97.6 F (36.4 C)  TempSrc: Oral  SpO2: 96%     General appearance: alert, cooperative, appears stated age and no distress Throat: lips, mucosa, and tongue normal; teeth and gums normal Back: no CVA tenderness Abdomen: soft, non-tender; bowel sounds normal; no masses or organomegaly; no guarding or rebound tenderness GU: declines Skin: warm and dry Psychological:  Alert and cooperative. Normal mood and affect.    Labs Reviewed  URINE CYTOLOGY ANCILLARY ONLY    No Known Allergies   Social History   Social History  . Marital status: Single    Spouse name: N/A  . Number of children: N/A  . Years of education: N/A   Occupational History  . Not on file.   Social History Main Topics  . Smoking status: Current Every Day Smoker    Packs/day: 0.25    Types: Cigarettes  . Smokeless tobacco: Never Used  . Alcohol use Yes  . Drug use: No  . Sexual activity: Not on file   Other Topics Concern  . Not on file   Social History Narrative  . No narrative on file          Vanessa Kick,  MD 03/17/17 1515

## 2017-03-17 NOTE — ED Triage Notes (Signed)
Patient had sex with partner today.  [facility] called reporting penis feeling odd.  Patient had mixed lotion and some oil and thought this may had irritated his skin.  But patient is wanting std testing

## 2017-03-19 LAB — URINE CYTOLOGY ANCILLARY ONLY
Chlamydia: POSITIVE — AB
Neisseria Gonorrhea: NEGATIVE
Trichomonas: POSITIVE — AB

## 2017-03-20 ENCOUNTER — Telehealth (HOSPITAL_COMMUNITY): Payer: Self-pay | Admitting: Internal Medicine

## 2017-03-20 MED ORDER — AZITHROMYCIN 500 MG PO TABS: 1000.0000 mg | ORAL_TABLET | Freq: Once | ORAL | 0 refills | Status: AC

## 2017-03-20 MED ORDER — METRONIDAZOLE 500 MG PO TABS: 500.0000 mg | ORAL_TABLET | Freq: Two times a day (BID) | ORAL | 0 refills | Status: DC

## 2017-03-20 NOTE — Telephone Encounter (Signed)
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   Rx zithromax was sent to the pharmacy of record, [facility] on [address] at [employer].   Please refrain from sexual intercourse for 7 days to give the medicine time to work.   Sexual partners need to be notified and tested/treated.  Condoms may reduce risk of reinfection.   Test for trichomonas was also positive.  Rx metronidazole was also sent to the pharmacy.   Tests for gardnerella (BV) and candida (yeast) are still pending.   Recheck or followup with PCP for further evaluation if symptoms are not improving.  LM

## 2017-07-15 ENCOUNTER — Inpatient Hospital Stay (HOSPITAL_COMMUNITY): Payer: Medicaid Other

## 2017-07-15 ENCOUNTER — Encounter (HOSPITAL_COMMUNITY): Payer: Self-pay

## 2017-07-15 ENCOUNTER — Inpatient Hospital Stay (HOSPITAL_COMMUNITY)
Admission: AD | Admit: 2017-07-15 | Discharge: 2017-07-15 | Disposition: A | Discharge status: Home / Self Care | Payer: Medicaid Other | Source: Ambulatory Visit | Attending: Obstetrics and Gynecology | Admitting: Obstetrics and Gynecology

## 2017-07-15 DIAGNOSIS — O3411 Maternal care for benign tumor of corpus uteri, first trimester: Secondary | ICD-10-CM | POA: Diagnosis not present

## 2017-07-15 DIAGNOSIS — Z3A00 Weeks of gestation of pregnancy not specified: Secondary | ICD-10-CM | POA: Diagnosis not present

## 2017-07-15 DIAGNOSIS — Z3201 Encounter for pregnancy test, result positive: Secondary | ICD-10-CM | POA: Diagnosis not present

## 2017-07-15 DIAGNOSIS — O200 Threatened abortion: Secondary | ICD-10-CM | POA: Insufficient documentation

## 2017-07-15 DIAGNOSIS — R109 Unspecified abdominal pain: Secondary | ICD-10-CM | POA: Diagnosis not present

## 2017-07-15 DIAGNOSIS — O209 Hemorrhage in early pregnancy, unspecified: Secondary | ICD-10-CM | POA: Diagnosis present

## 2017-07-15 DIAGNOSIS — O99331 Smoking (tobacco) complicating pregnancy, first trimester: Secondary | ICD-10-CM | POA: Insufficient documentation

## 2017-07-15 DIAGNOSIS — D252 Subserosal leiomyoma of uterus: Secondary | ICD-10-CM | POA: Diagnosis not present

## 2017-07-15 DIAGNOSIS — O09521 Supervision of elderly multigravida, first trimester: Secondary | ICD-10-CM | POA: Diagnosis present

## 2017-07-15 DIAGNOSIS — F17210 Nicotine dependence, cigarettes, uncomplicated: Secondary | ICD-10-CM | POA: Insufficient documentation

## 2017-07-15 DIAGNOSIS — O3680X0 Pregnancy with inconclusive fetal viability, not applicable or unspecified: Secondary | ICD-10-CM | POA: Insufficient documentation

## 2017-07-15 DIAGNOSIS — O26891 Other specified pregnancy related conditions, first trimester: Secondary | ICD-10-CM | POA: Diagnosis present

## 2017-07-15 DIAGNOSIS — R030 Elevated blood-pressure reading, without diagnosis of hypertension: Secondary | ICD-10-CM

## 2017-07-15 HISTORY — DX: Other specified health status: Z78.9

## 2017-07-15 LAB — URINALYSIS, MICROSCOPIC (REFLEX)

## 2017-07-15 LAB — URINALYSIS, ROUTINE W REFLEX MICROSCOPIC
Specific Gravity, Urine: 1.028 (ref 1.005–1.030)
pH: 5 (ref 5.0–8.0)

## 2017-07-15 LAB — POCT PREGNANCY, URINE: PREG TEST UR: POSITIVE — AB

## 2017-07-15 LAB — CBC
HEMATOCRIT: 36.8 % (ref 36.0–46.0)
Hemoglobin: 12.4 g/dL (ref 12.0–15.0)
MCH: 29.5 pg (ref 26.0–34.0)
MCHC: 33.7 g/dL (ref 30.0–36.0)
MCV: 87.4 fL (ref 78.0–100.0)
PLATELETS: 186 10*3/uL (ref 150–400)
RBC: 4.21 MIL/uL (ref 3.87–5.11)
RDW: 14.4 % (ref 11.5–15.5)
WBC: 9.4 10*3/uL (ref 4.0–10.5)

## 2017-07-15 LAB — WET PREP, GENITAL
Clue Cells Wet Prep HPF POC: NONE SEEN
SPERM: NONE SEEN
Trich, Wet Prep: NONE SEEN
YEAST WET PREP: NONE SEEN

## 2017-07-15 LAB — ABO/RH: ABO/RH(D): A POS

## 2017-07-15 LAB — HCG, QUANTITATIVE, PREGNANCY: hCG, Beta Chain, Quant, S: 20928 m[IU]/mL — ABNORMAL HIGH (ref <5)

## 2017-07-15 MED ORDER — KETOROLAC TROMETHAMINE 60 MG/2ML IM SOLN
60.0000 mg | INTRAMUSCULAR | Status: AC
  Administered 2017-07-15: 60 mg via INTRAMUSCULAR
  Filled 2017-07-15: qty 2

## 2017-07-15 MED ORDER — OXYCODONE-ACETAMINOPHEN 5-325 MG PO TABS: 1.0000 | ORAL_TABLET | ORAL | 0 refills | Status: DC | PRN

## 2017-07-15 MED ORDER — IBUPROFEN 800 MG PO TABS: 800.0000 mg | ORAL_TABLET | Freq: Three times a day (TID) | ORAL | 0 refills | Status: DC

## 2017-07-15 NOTE — MAU Provider Note (Signed)
History     CSN: [id number]  Arrival date and time: 07/15/17 0932   First Provider Initiated Contact with Patient 07/15/17 0809      Chief Complaint  Patient presents with  . Abdominal Pain  . Vaginal Bleeding   HPI  [name] is a 37 y.o. year old G27P2002 female at [redacted]w[redacted]d weeks gestation who presents to MAU reporting strong cramping that started last night and VB with clots; golf-ball sized clot @ 0600. She had 2 HPT last week. She states this is an unplanned, undesired pregnancy. She has an appt for a TAB @ [facility] on Tuesday 2/19. Pain rated 10/10; requesting something for pain.  Past Medical History:  Diagnosis Date  . Medical history non-contributory     Past Surgical History:  Procedure Laterality Date  . CESAREAN SECTION      History reviewed. No pertinent family history.  Social History   Tobacco Use  . Smoking status: Current Some Day Smoker    Packs/day: 0.25    Types: Cigarettes  . Smokeless tobacco: Never Used  Substance Use Topics  . Alcohol use: Yes    Comment: occasionally  . Drug use: No    Allergies: No Known Allergies  Medications Prior to Admission  Medication Sig Dispense Refill Last Dose  . cephALEXin (KEFLEX) 500 MG capsule Take 1 capsule (500 mg total) by mouth 4 (four) times daily. 40 capsule 0 Taking  . HYDROcodone-acetaminophen (NORCO/VICODIN) 5-325 MG per tablet Take 1 tablet by mouth every 4 (four) hours as needed. 20 tablet 0 Taking  . ibuprofen (ADVIL,MOTRIN) 800 MG tablet Take 1 tablet (800 mg total) by mouth 3 (three) times daily. 21 tablet 0 Taking  . methocarbamol (ROBAXIN) 500 MG tablet Take 1 tablet (500 mg total) by mouth 2 (two) times daily. 20 tablet 0   . metroNIDAZOLE (FLAGYL) 500 MG tablet Take 1 tablet (500 mg total) by mouth 2 (two) times daily. 14 tablet 0   . naproxen (NAPROSYN) 500 MG tablet Take 1 tablet (500 mg total) by mouth 2 (two) times daily. 30 tablet 0   . PERCOCET 5-325 MG per tablet Take 1  tablet by mouth every 6 (six) hours as needed for pain. 15 tablet 0 Taking    Review of Systems  Constitutional: Negative.   HENT: Negative.   Eyes: Negative.   Respiratory: Negative.   Cardiovascular: Negative.   Gastrointestinal: Negative.   Endocrine: Negative.   Genitourinary: Positive for pelvic pain and vaginal bleeding (heavy with clots).  Musculoskeletal: Negative.   Skin: Negative.   Allergic/Immunologic: Negative.   Neurological: Negative.   Hematological: Negative.   Psychiatric/Behavioral: Negative.    Physical Exam   Blood pressure (!) 156/94, pulse 83, temperature 98.6 F (37 C), temperature source Oral, resp. rate 18, height 6' (1.829 m), weight (!) 395 lb (179.2 kg), last menstrual period 04/27/2017.  Physical Exam  Nursing note and vitals reviewed. Constitutional: She is oriented to person, place, and time. She appears well-developed and well-nourished.  HENT:  Head: Normocephalic and atraumatic.  Eyes: Pupils are equal, round, and reactive to light.  Neck: Normal range of motion.  Cardiovascular: Normal rate, regular rhythm and normal heart sounds.  Respiratory: Effort normal and breath sounds normal.  GI: Soft. Bowel sounds are normal.  Genitourinary:  Genitourinary Comments: Uterus: difficult to assess d/t body habitus, cx: smooth, pink, no lesions, moderate amt of dark, red blood in vaginal vault and BRB coming from cervical os, closed/long/firm, no  CMT or friability, no adnexal tenderness   Musculoskeletal: Normal range of motion.  Neurological: She is alert and oriented to person, place, and time.  Skin: Skin is warm and dry.  Psychiatric: She has a normal mood and affect. Her behavior is normal. Judgment and thought content normal.    MAU Course  Procedures  MDM CCUA UPT Wet Prep Toradol 60 mg IM injection -- Some relief however minimal.  GC/CT -- pending HIV -- pending  Results for orders placed or performed during the hospital encounter  of 07/15/17 (from the past 24 hour(s))  Urinalysis, Routine w reflex microscopic     Status: Abnormal   Collection Time: 07/15/17  7:25 AM  Result Value Ref Range   Color, Urine RED (A) YELLOW   APPearance TURBID (A) CLEAR   Specific Gravity, Urine 1.028 1.005 - 1.030   pH 5.0 5.0 - 8.0   Glucose, UA (A) NEGATIVE mg/dL    TEST NOT REPORTED DUE TO COLOR INTERFERENCE OF URINE PIGMENT   Hgb urine dipstick (A) NEGATIVE    TEST NOT REPORTED DUE TO COLOR INTERFERENCE OF URINE PIGMENT   Bilirubin Urine (A) NEGATIVE    TEST NOT REPORTED DUE TO COLOR INTERFERENCE OF URINE PIGMENT   Ketones, ur (A) NEGATIVE mg/dL    TEST NOT REPORTED DUE TO COLOR INTERFERENCE OF URINE PIGMENT   Protein, ur (A) NEGATIVE mg/dL    TEST NOT REPORTED DUE TO COLOR INTERFERENCE OF URINE PIGMENT   Nitrite (A) NEGATIVE    TEST NOT REPORTED DUE TO COLOR INTERFERENCE OF URINE PIGMENT   Leukocytes, UA (A) NEGATIVE    TEST NOT REPORTED DUE TO COLOR INTERFERENCE OF URINE PIGMENT  Urinalysis, Microscopic (reflex)     Status: Abnormal   Collection Time: 07/15/17  7:25 AM  Result Value Ref Range   RBC / HPF TOO NUMEROUS TO COUNT 0 - 5 RBC/hpf   WBC, UA 0-5 0 - 5 WBC/hpf   Bacteria, UA MANY (A) NONE SEEN   Squamous Epithelial / LPF 0-5 (A) NONE SEEN  Pregnancy, urine POC     Status: Abnormal   Collection Time: 07/15/17  7:37 AM  Result Value Ref Range   Preg Test, Ur POSITIVE (A) NEGATIVE  Wet prep, genital     Status: Abnormal   Collection Time: 07/15/17  7:51 AM  Result Value Ref Range   Yeast Wet Prep HPF POC NONE SEEN NONE SEEN   Trich, Wet Prep NONE SEEN NONE SEEN   Clue Cells Wet Prep HPF POC NONE SEEN NONE SEEN   WBC, Wet Prep HPF POC FEW (A) NONE SEEN   Sperm NONE SEEN   CBC     Status: None   Collection Time: 07/15/17  8:05 AM  Result Value Ref Range   WBC 9.4 4.0 - 10.5 K/uL   RBC 4.21 3.87 - 5.11 MIL/uL   Hemoglobin 12.4 12.0 - 15.0 g/dL   HCT 36.8 36.0 - 46.0 %   MCV 87.4 78.0 - 100.0 fL   MCH 29.5  26.0 - 34.0 pg   MCHC 33.7 30.0 - 36.0 g/dL   RDW 14.4 11.5 - 15.5 %   Platelets 186 150 - 400 K/uL  ABO/Rh     Status: None (Preliminary result)   Collection Time: 07/15/17  8:05 AM  Result Value Ref Range   ABO/RH(D)      A POS Performed at [facility], [address] [place], [place] [address]   hCG, quantitative, pregnancy  Status: Abnormal   Collection Time: 07/15/17  8:05 AM  Result Value Ref Range   hCG, Beta Chain, Quant, S 20,928 (H) <5 mIU/mL    US Ob Comp Less 14 Wks  Result Date: 07/15/2017 CLINICAL DATA:  Vaginal bleeding with positive pregnancy test. EXAM: OBSTETRIC <14 WK [place] AND TRANSVAGINAL OB US TECHNIQUE: Both transabdominal and transvaginal ultrasound examinations were performed for complete evaluation of the gestation as well as the maternal uterus, adnexal regions, and pelvic cul-de-sac. Transvaginal technique was performed to assess early pregnancy. COMPARISON:  None. FINDINGS: Intrauterine gestational sac: Not visualized. Yolk sac:  No Embryo:  No Cardiac Activity: No Subchorionic hemorrhage:  None visualized. Maternal uterus/adnexae: Endometrium in the lower uterine segment appears thickened and somewhat hyperechoic. This could represent some clot within the canal of the lower uterine segment. 4.7 cm subserosal fibroid identified left uterus. Neither ovary could be identified on transabdominal or endovaginal scanning. No adnexal mass is evident. No appreciable free fluid in the cul-de-sac. IMPRESSION: 1. No intrauterine gestational sac evident. No adnexal mass. Given the history of a positive pregnancy test, differential considerations include intrauterine gestation too early to visualize, completed abortion, or nonvisualized ectopic pregnancy. Electronically Signed   By: Misty Stanley M.D.   On: 07/15/2017 09:13   US Ob Transvaginal  Result Date: 07/15/2017 CLINICAL DATA:  Vaginal bleeding with positive pregnancy test. EXAM: OBSTETRIC <14 WK [place] AND  TRANSVAGINAL OB US TECHNIQUE: Both transabdominal and transvaginal ultrasound examinations were performed for complete evaluation of the gestation as well as the maternal uterus, adnexal regions, and pelvic cul-de-sac. Transvaginal technique was performed to assess early pregnancy. COMPARISON:  None. FINDINGS: Intrauterine gestational sac: Not visualized. Yolk sac:  No Embryo:  No Cardiac Activity: No Subchorionic hemorrhage:  None visualized. Maternal uterus/adnexae: Endometrium in the lower uterine segment appears thickened and somewhat hyperechoic. This could represent some clot within the canal of the lower uterine segment. 4.7 cm subserosal fibroid identified left uterus. Neither ovary could be identified on transabdominal or endovaginal scanning. No adnexal mass is evident. No appreciable free fluid in the cul-de-sac. IMPRESSION: 1. No intrauterine gestational sac evident. No adnexal mass. Given the history of a positive pregnancy test, differential considerations include intrauterine gestation too early to visualize, completed abortion, or nonvisualized ectopic pregnancy. Electronically Signed   By: Misty Stanley M.D.   On: 07/15/2017 09:13   Report given to and care assumed by Noni Saupe, FNP @ [phone] Resumed care of the patient @ [phone] Given the amount of menstrual like bleeding, diagnoses favoring SAB. Will follow patient closely in the office.  A positive blood type   Laury Deep, MSN, CNM 07/15/2017, 8:09 AM Assessment and Plan    A:  Pregnancy of unknown anatomic location  Bleeding in early pregnancy  Threatened miscarriage  Elevated BP without diagnosis of hypertension   P:  Discharge home with strict return precautions Bleeding precautions Pelvic rest Rx: Percocet, ibuprofen  Return to MAU if symptoms worsen Return to the [facility] on Tuesday at 0900 for repeat stat Quant.   Noni Saupe I, NP 07/15/2017 12:00 PM

## 2017-07-15 NOTE — MAU Note (Signed)
Unsure about keeping pregnancy- has appointment for TAB on Tuesday  x2 +HPT last week  0600 passed a golf ball sized clot  Some cramping since last night

## 2017-07-15 NOTE — Discharge Instructions (Signed)
Threatened Miscarriage °A threatened miscarriage is when you have vaginal bleeding during your first 20 weeks of pregnancy but the pregnancy has not ended. Your doctor will do tests to make sure you are still pregnant. The cause of the bleeding may not be known. This condition does not mean your pregnancy will end. It does increase the risk of it ending (complete miscarriage). °Follow these instructions at home: °· Make sure you keep all your doctor visits for prenatal care. °· Get plenty of rest. °· Do not have sex or use tampons if you have vaginal bleeding. °· Do not douche. °· Do not smoke or use drugs. °· Do not drink alcohol. °· Avoid caffeine. °Contact a doctor if: °· You have light bleeding from your vagina. °· You have belly pain or cramping. °· You have a fever. °Get help right away if: °· You have heavy bleeding from your vagina. °· You have clots of blood coming from your vagina. °· You have bad pain or cramps in your low back or belly. °· You have fever, chills, and bad belly pain. °This information is not intended to replace advice given to you by your health care provider. Make sure you discuss any questions you have with your health care provider. °Document Released: 04/27/2008 Document Revised: 10/21/2015 Document Reviewed: 03/11/2013 °Elsevier Interactive Patient Education © 2018 [employer] ° °

## 2017-07-16 ENCOUNTER — Other Ambulatory Visit: Payer: Self-pay

## 2017-07-16 ENCOUNTER — Encounter (HOSPITAL_COMMUNITY): Payer: Self-pay | Admitting: *Deleted

## 2017-07-16 ENCOUNTER — Inpatient Hospital Stay (HOSPITAL_COMMUNITY)
Admission: AD | Admit: 2017-07-16 | Discharge: 2017-07-16 | Disposition: A | Discharge status: Home / Self Care | Payer: Medicaid Other | Source: Ambulatory Visit | Attending: Obstetrics & Gynecology | Admitting: Obstetrics & Gynecology

## 2017-07-16 DIAGNOSIS — F17210 Nicotine dependence, cigarettes, uncomplicated: Secondary | ICD-10-CM | POA: Diagnosis not present

## 2017-07-16 DIAGNOSIS — O034 Incomplete spontaneous abortion without complication: Secondary | ICD-10-CM

## 2017-07-16 DIAGNOSIS — Z3201 Encounter for pregnancy test, result positive: Secondary | ICD-10-CM | POA: Diagnosis not present

## 2017-07-16 DIAGNOSIS — O3680X0 Pregnancy with inconclusive fetal viability, not applicable or unspecified: Secondary | ICD-10-CM | POA: Diagnosis not present

## 2017-07-16 DIAGNOSIS — O99331 Smoking (tobacco) complicating pregnancy, first trimester: Secondary | ICD-10-CM | POA: Insufficient documentation

## 2017-07-16 DIAGNOSIS — O3411 Maternal care for benign tumor of corpus uteri, first trimester: Secondary | ICD-10-CM | POA: Diagnosis not present

## 2017-07-16 DIAGNOSIS — R110 Nausea: Secondary | ICD-10-CM | POA: Diagnosis present

## 2017-07-16 DIAGNOSIS — D259 Leiomyoma of uterus, unspecified: Secondary | ICD-10-CM | POA: Diagnosis not present

## 2017-07-16 DIAGNOSIS — O3410 Maternal care for benign tumor of corpus uteri, unspecified trimester: Secondary | ICD-10-CM

## 2017-07-16 DIAGNOSIS — R109 Unspecified abdominal pain: Secondary | ICD-10-CM | POA: Insufficient documentation

## 2017-07-16 DIAGNOSIS — Z3A11 11 weeks gestation of pregnancy: Secondary | ICD-10-CM | POA: Diagnosis not present

## 2017-07-16 DIAGNOSIS — O209 Hemorrhage in early pregnancy, unspecified: Secondary | ICD-10-CM | POA: Insufficient documentation

## 2017-07-16 DIAGNOSIS — D252 Subserosal leiomyoma of uterus: Secondary | ICD-10-CM | POA: Insufficient documentation

## 2017-07-16 DIAGNOSIS — O26891 Other specified pregnancy related conditions, first trimester: Secondary | ICD-10-CM | POA: Diagnosis not present

## 2017-07-16 DIAGNOSIS — Z9889 Other specified postprocedural states: Secondary | ICD-10-CM | POA: Insufficient documentation

## 2017-07-16 LAB — HIV ANTIBODY (ROUTINE TESTING W REFLEX): HIV SCREEN 4TH GENERATION: NONREACTIVE

## 2017-07-16 LAB — CBC
HEMATOCRIT: 35.4 % — AB (ref 36.0–46.0)
HEMOGLOBIN: 11.8 g/dL — AB (ref 12.0–15.0)
MCH: 29.4 pg (ref 26.0–34.0)
MCHC: 33.3 g/dL (ref 30.0–36.0)
MCV: 88.1 fL (ref 78.0–100.0)
Platelets: 190 10*3/uL (ref 150–400)
RBC: 4.02 MIL/uL (ref 3.87–5.11)
RDW: 14.3 % (ref 11.5–15.5)
WBC: 11.5 10*3/uL — AB (ref 4.0–10.5)

## 2017-07-16 LAB — HCG, QUANTITATIVE, PREGNANCY: hCG, Beta Chain, Quant, S: 13460 m[IU]/mL — ABNORMAL HIGH (ref <5)

## 2017-07-16 LAB — GC/CHLAMYDIA PROBE AMP (~~LOC~~) NOT AT ARMC
Chlamydia: NEGATIVE
Neisseria Gonorrhea: NEGATIVE

## 2017-07-16 MED ORDER — LACTATED RINGERS IV SOLN
INTRAVENOUS | Status: DC
  Administered 2017-07-16: 13:00:00 via INTRAVENOUS

## 2017-07-16 MED ORDER — HYDROMORPHONE HCL 2 MG/ML IJ SOLN
2.0000 mg | Freq: Once | INTRAMUSCULAR | Status: AC
  Administered 2017-07-16: 2 mg via INTRAVENOUS
  Filled 2017-07-16: qty 1

## 2017-07-16 NOTE — MAU Provider Note (Signed)
History     CSN: [id number]  Arrival date and time: 07/16/17 8250   First Provider Initiated Contact with Patient 07/16/17 1102      Chief Complaint  Patient presents with  . Abdominal Pain  . Vaginal Bleeding  . Nausea   HPI Jill Stevenson 37 y.o. [redacted]w[redacted]d  Comes to MAU today with lots of cramping and heavier vaginal bleeding.  Was seen yesterday in MAU and diagnosed with pregnancy of unknown anatomic location.  Took Ibuprofen 800 mg twice on yesterday but today her cramping is worse and she took 2 ibuprofen 800 mg tablets and then one percocet tablet and has not noticed any relief of the cramping today.  Is having heavier vaginal bleeding than yesterday.  Was worried as they did not see anything in her uterus yesterday and her bleeding and cramping is worse.  Note from 07-15-17 and ultrasound results reviewed.   OB History    Gravida Para Term Preterm AB Living   3 2 2     2    SAB TAB Ectopic Multiple Live Births                  Past Medical History:  Diagnosis Date  . Medical history non-contributory     Past Surgical History:  Procedure Laterality Date  . CESAREAN SECTION      History reviewed. No pertinent family history.  Social History   Tobacco Use  . Smoking status: Current Some Day Smoker    Packs/day: 0.25    Types: Cigarettes  . Smokeless tobacco: Never Used  Substance Use Topics  . Alcohol use: Yes    Comment: occasionally  . Drug use: No    Allergies: No Known Allergies  Medications Prior to Admission  Medication Sig Dispense Refill Last Dose  . acetaminophen (TYLENOL) 500 MG tablet Take 500 mg by mouth every 6 (six) hours as needed for mild pain.   Past Week at Unknown time  . ibuprofen (ADVIL,MOTRIN) 800 MG tablet Take 1 tablet (800 mg total) by mouth 3 (three) times daily. 21 tablet 0 07/16/2017 at 0800  . oxyCODONE-acetaminophen (PERCOCET/ROXICET) 5-325 MG tablet Take 1-2 tablets by mouth every 4 (four) hours as needed for severe pain. 6  tablet 0 07/16/2017 at 0800    Review of Systems  Constitutional: Negative for fever.  Gastrointestinal: Positive for abdominal pain. Negative for nausea and vomiting.  Genitourinary: Positive for vaginal bleeding. Negative for dysuria and vaginal discharge.   Physical Exam   Blood pressure 131/89, pulse 71, temperature 98.2 F (36.8 C), temperature source Oral, resp. rate (!) 22, height 6' (1.829 m), weight (!) 398 lb 12 oz (180.9 kg), last menstrual period 04/27/2017, SpO2 99 %.  Physical Exam  Nursing note and vitals reviewed. Constitutional: She is oriented to person, place, and time. She appears well-developed.  Morbidly obese Seems to be in pain,  Rocking in the bed.  HENT:  Head: Normocephalic.  Eyes: EOM are normal.  Neck: Neck supple.  GI: Soft. There is tenderness. There is no rebound and no guarding.  Abdomen is soft  Musculoskeletal: Normal range of motion.  Neurological: She is alert and oriented to person, place, and time.  Skin: Skin is warm and dry.  Psychiatric: She has a normal mood and affect.    MAU Course  Procedures Results for orders placed or performed during the hospital encounter of 07/16/17 (from the past 24 hour(s))  CBC     Status: Abnormal   Collection  Time: 07/16/17 11:10 AM  Result Value Ref Range   WBC 11.5 (H) 4.0 - 10.5 K/uL   RBC 4.02 3.87 - 5.11 MIL/uL   Hemoglobin 11.8 (Jill) 12.0 - 15.0 g/dL   HCT 35.4 (Jill) 36.0 - 46.0 %   MCV 88.1 78.0 - 100.0 fL   MCH 29.4 26.0 - 34.0 pg   MCHC 33.3 30.0 - 36.0 g/dL   RDW 14.3 11.5 - 15.5 %   Platelets 190 150 - 400 K/uL  hCG, quantitative, pregnancy     Status: Abnormal   Collection Time: 07/16/17 11:10 AM  Result Value Ref Range   hCG, Beta Chain, Quant, S 13,460 (H) <5 mIU/mL    MDM Consult with Dr. Harolyn Rutherford about the plan of care.  Will give Diluadid 2 mg IV for pain.  If pain is unrelieved, will get ultrasound or if abdomen becomes hard.  Dilaudid gave great pain relief for her.  Was able  to rest and was quiet in bed.  Assessment and Plan  Pregnancy of unknown anatomic location but quant is falling well and is having vaginal bleeding - possibly is having a miscarriage Abdominal pain is greatly reduced with Dilaudid IV. Uterine fibroid - 4.7 subserosal fibroid noted on [place] which is likely causing cramping to be worse - client was unaware of fibroid and has not had severe menses previously  Plan Will follow up in the clinic in one week for repeat quant.  Message sent to the clinic. Expect the clinic to call you and give you an appointment to come in for lab work in one week. Continue your medication for pain. Expect to keep bleeding. Return if your pain is worse that what you were having today. Return if your vaginal bleeding is severe and soaking through your clothing and running onto the floor. Reviewed this plan carefully with client and she is in agreement with this plan today.  Feels stable and can go home.  Jill Stevenson Jill Stevenson 07/16/2017, 11:39 AM

## 2017-07-16 NOTE — MAU Note (Signed)
Was here yesterday, didn't see anything on [place].  Having increased bleeding and pain. Nauseated  (pt unable to sit still)

## 2017-07-16 NOTE — Discharge Instructions (Signed)
Expect the clinic to call you and give you an appointment to come in for lab work in one week. Continue your medication for pain. Expect to keep bleeding.' Return if your pain is worse that what you were having today. Return if your vaginal bleeding is severe and soaking through your clothing and running onto the floor.

## 2017-07-17 ENCOUNTER — Inpatient Hospital Stay (HOSPITAL_COMMUNITY)
Admission: AD | Admit: 2017-07-17 | Discharge: 2017-07-17 | Disposition: A | Discharge status: Home / Self Care | Payer: Medicaid Other | Source: Ambulatory Visit | Attending: Obstetrics & Gynecology | Admitting: Obstetrics & Gynecology

## 2017-07-17 ENCOUNTER — Encounter (HOSPITAL_COMMUNITY): Payer: Self-pay | Admitting: *Deleted

## 2017-07-17 DIAGNOSIS — O209 Hemorrhage in early pregnancy, unspecified: Secondary | ICD-10-CM | POA: Diagnosis present

## 2017-07-17 DIAGNOSIS — O99331 Smoking (tobacco) complicating pregnancy, first trimester: Secondary | ICD-10-CM | POA: Diagnosis not present

## 2017-07-17 DIAGNOSIS — R102 Pelvic and perineal pain: Secondary | ICD-10-CM | POA: Insufficient documentation

## 2017-07-17 DIAGNOSIS — F17210 Nicotine dependence, cigarettes, uncomplicated: Secondary | ICD-10-CM | POA: Diagnosis not present

## 2017-07-17 DIAGNOSIS — O26891 Other specified pregnancy related conditions, first trimester: Secondary | ICD-10-CM | POA: Insufficient documentation

## 2017-07-17 DIAGNOSIS — Z3A11 11 weeks gestation of pregnancy: Secondary | ICD-10-CM | POA: Insufficient documentation

## 2017-07-17 DIAGNOSIS — O039 Complete or unspecified spontaneous abortion without complication: Secondary | ICD-10-CM | POA: Insufficient documentation

## 2017-07-17 LAB — CBC
HCT: 34.9 % — ABNORMAL LOW (ref 36.0–46.0)
HEMOGLOBIN: 11.6 g/dL — AB (ref 12.0–15.0)
MCH: 29.1 pg (ref 26.0–34.0)
MCHC: 33.2 g/dL (ref 30.0–36.0)
MCV: 87.5 fL (ref 78.0–100.0)
PLATELETS: 190 10*3/uL (ref 150–400)
RBC: 3.99 MIL/uL (ref 3.87–5.11)
RDW: 14.2 % (ref 11.5–15.5)
WBC: 9 10*3/uL (ref 4.0–10.5)

## 2017-07-17 LAB — HCG, QUANTITATIVE, PREGNANCY: HCG, BETA CHAIN, QUANT, S: 9627 m[IU]/mL — AB (ref <5)

## 2017-07-17 MED ORDER — MISOPROSTOL 200 MCG PO TABS
800.0000 ug | ORAL_TABLET | Freq: Once | ORAL | Status: AC
  Administered 2017-07-17: 800 ug via BUCCAL
  Filled 2017-07-17: qty 4

## 2017-07-17 MED ORDER — IBUPROFEN 800 MG PO TABS: 800.0000 mg | ORAL_TABLET | Freq: Three times a day (TID) | ORAL | 0 refills | Status: DC | PRN

## 2017-07-17 MED ORDER — HYDROCODONE-ACETAMINOPHEN 5-325 MG PO TABS: 1.0000 | ORAL_TABLET | ORAL | 0 refills | Status: DC | PRN

## 2017-07-17 MED ORDER — HYDROMORPHONE HCL 1 MG/ML IJ SOLN
1.0000 mg | Freq: Once | INTRAMUSCULAR | Status: AC
  Administered 2017-07-17: 1 mg via INTRAMUSCULAR
  Filled 2017-07-17: qty 1

## 2017-07-17 NOTE — MAU Note (Signed)
Urine is in the Lab 

## 2017-07-17 NOTE — MAU Provider Note (Signed)
History     CSN: [id number]  Arrival date and time: 07/17/17 1634   First Provider Initiated Contact with Patient 07/17/17 1715      Chief Complaint  Patient presents with  . Pelvic Pain   Jill Stevenson is a 37 y.o. G3P2002 at [redacted]w[redacted]d who presents today with vaginal bleeding and pain. She was seen yesterday and dx with SAB due to rapid decline in HCG. She states that she has continued to bleed and pass clots. However, now she is having severe pain. She reports that she took Advil and ibuprofen just before coming here, and it has not helped with the pain. She was DC home yesterday with 6 percocet tabs, and she has taken all of those without significant relief.    Pelvic Pain  The patient's primary symptoms include pelvic pain and vaginal bleeding. This is a new problem. The current episode started in the past 7 days. The problem occurs constantly. The problem has been gradually worsening. Pain severity now: 10/10. The problem affects both sides. She is pregnant. Pertinent negatives include no chills, dysuria, fever, frequency, nausea, urgency or vomiting. The vaginal bleeding is heavier than menses. She has been passing clots. Nothing aggravates the symptoms. She has tried NSAIDs for the symptoms. The treatment provided no relief.      Past Medical History:  Diagnosis Date  . Medical history non-contributory     Past Surgical History:  Procedure Laterality Date  . CESAREAN SECTION      No family history on file.  Social History   Tobacco Use  . Smoking status: Current Some Day Smoker    Packs/day: 0.25    Types: Cigarettes  . Smokeless tobacco: Never Used  Substance Use Topics  . Alcohol use: Yes    Comment: occasionally  . Drug use: No    Allergies: No Known Allergies  Medications Prior to Admission  Medication Sig Dispense Refill Last Dose  . acetaminophen (TYLENOL) 500 MG tablet Take 500 mg by mouth every 6 (six) hours as needed for mild pain.   Past Week at  Unknown time  . ibuprofen (ADVIL,MOTRIN) 800 MG tablet Take 1 tablet (800 mg total) by mouth 3 (three) times daily. 21 tablet 0 07/16/2017 at 0800  . oxyCODONE-acetaminophen (PERCOCET/ROXICET) 5-325 MG tablet Take 1-2 tablets by mouth every 4 (four) hours as needed for severe pain. 6 tablet 0 07/16/2017 at 0800    Review of Systems  Constitutional: Negative for chills and fever.  Gastrointestinal: Negative for nausea and vomiting.  Genitourinary: Positive for pelvic pain and vaginal bleeding. Negative for dysuria, frequency and urgency.   Physical Exam   Blood pressure (!) 154/90, pulse 89, temperature (!) 97.4 F (36.3 C), temperature source Oral, resp. rate (!) 22, weight (!) 391 lb (177.4 kg), last menstrual period 04/27/2017, SpO2 100 %.  Physical Exam  Nursing note and vitals reviewed. Constitutional: She is oriented to person, place, and time. She appears well-developed and well-nourished. No distress.  HENT:  Head: Normocephalic.  Cardiovascular: Normal rate.  Respiratory: Effort normal.  GI: Soft. There is no tenderness. There is no rebound.  Genitourinary:  Genitourinary Comments:  External: no lesion Vagina: small amount of blood noted  Cervix: pink, smooth, some tissue seen at the os, but unable to grasp with ring forceps     Neurological: She is alert and oriented to person, place, and time.  Skin: Skin is warm and dry.  Psychiatric: She has a normal mood and affect.  Results for Jill, Stevenson (MRN [id number]) as of 07/17/2017 19:10  Ref. Range 07/15/2017 08:05 07/15/2017 08:49 07/16/2017 11:10 07/17/2017 17:41  HCG, Beta Chain, Quant, S Latest Ref Range: <5 mIU/mL 20,928 (H)  13,460 (H) 9,627 (H)    Results for orders placed or performed during the hospital encounter of 07/17/17 (from the past 24 hour(s))  CBC     Status: Abnormal   Collection Time: 07/17/17  5:41 PM  Result Value Ref Range   WBC 9.0 4.0 - 10.5 K/uL   RBC 3.99 3.87 - 5.11 MIL/uL   Hemoglobin 11.6  (L) 12.0 - 15.0 g/dL   HCT 34.9 (L) 36.0 - 46.0 %   MCV 87.5 78.0 - 100.0 fL   MCH 29.1 26.0 - 34.0 pg   MCHC 33.2 30.0 - 36.0 g/dL   RDW 14.2 11.5 - 15.5 %   Platelets 190 150 - 400 K/uL  hCG, quantitative, pregnancy     Status: Abnormal   Collection Time: 07/17/17  5:41 PM  Result Value Ref Range   hCG, Beta Chain, Quant, S 9,627 (H) <5 mIU/mL    MAU Course  Procedures  MDM Will give medication for pain and cytotec to see if the tissue can pass.  Will continue to monitor.  Patient has had Dilaudid. She reports that her pain is better at this time.  Assessment and Plan   1. Spontaneous abortion    DC home Comfort measures reviewed  Bleeding precautions RX: vicodin PRN #20, Ibuprofen 800mg  PRN #30  Return to MAU as needed   [place] [facility] Follow up.   Specialty:  Obstetrics and Gynecology Why:  On 07/23/17 as already scheduled  Contact information: [place] 07/17/2017, 5:26 PM

## 2017-07-17 NOTE — MAU Note (Signed)
Pt had miscarriage on Sunday. Has been having increasing pain and taking pain medication. 10/10. Pt is doubled over in pain. Bleeding but not severe.

## 2017-07-17 NOTE — Discharge Instructions (Signed)

## 2017-07-23 ENCOUNTER — Other Ambulatory Visit: Payer: Medicaid Other

## 2017-07-23 ENCOUNTER — Other Ambulatory Visit: Payer: Self-pay | Admitting: *Deleted

## 2017-07-23 DIAGNOSIS — Z3200 Encounter for pregnancy test, result unknown: Secondary | ICD-10-CM

## 2017-07-24 LAB — BETA HCG QUANT (REF LAB): HCG QUANT: 547 m[IU]/mL

## 2017-08-30 ENCOUNTER — Ambulatory Visit (INDEPENDENT_AMBULATORY_CARE_PROVIDER_SITE_OTHER): Payer: Medicaid Other | Admitting: Obstetrics & Gynecology

## 2017-08-30 ENCOUNTER — Other Ambulatory Visit (HOSPITAL_COMMUNITY)
Admission: RE | Admit: 2017-08-30 | Discharge: 2017-08-30 | Disposition: A | Discharge status: Home / Self Care | Payer: Medicaid Other | Source: Ambulatory Visit | Attending: Obstetrics & Gynecology | Admitting: Obstetrics & Gynecology

## 2017-08-30 ENCOUNTER — Encounter: Payer: Self-pay | Admitting: Obstetrics & Gynecology

## 2017-08-30 VITALS — BP 150/104 | HR 70 | Wt 379.6 lb

## 2017-08-30 DIAGNOSIS — Z113 Encounter for screening for infections with a predominantly sexual mode of transmission: Secondary | ICD-10-CM

## 2017-08-30 DIAGNOSIS — Z3202 Encounter for pregnancy test, result negative: Secondary | ICD-10-CM | POA: Diagnosis present

## 2017-08-30 DIAGNOSIS — Z01419 Encounter for gynecological examination (general) (routine) without abnormal findings: Secondary | ICD-10-CM | POA: Insufficient documentation

## 2017-08-30 NOTE — Progress Notes (Signed)
GYNECOLOGY ANNUAL PREVENTATIVE CARE ENCOUNTER NOTE  Subjective:   Jill Stevenson is a 37 y.o. G53P2002 female here for a routine annual gynecologic exam. Had a SAB in February.  Current complaints: 1 menstrual cycle after miscarriage. She complains that this was heavier than normal and with significant cramping.  Denies other abnormal vaginal bleeding, discharge, pelvic pain, problems with intercourse or other gynecologic concerns.   Gynecologic History LMP: ~08/18/17 Contraception: none, current abstinence  Last Pap: not sure. No hx of abnormal pap Last mammogram: none.  Obstetric History OB History  Gravida Para Term Preterm AB Living  3 2 2     2   SAB TAB Ectopic Multiple Live Births               # Outcome Date GA Lbr Len/2nd Weight Sex Delivery Anes PTL Lv  3 Gravida           2 Term      CS-Unspec     1 Term      CS-Unspec       Past Medical History:  Diagnosis Date  . Medical history non-contributory     Past Surgical History:  Procedure Laterality Date  . CESAREAN SECTION      Current Outpatient Medications on File Prior to Visit  Medication Sig Dispense Refill  . acetaminophen (TYLENOL) 500 MG tablet Take 500 mg by mouth every 6 (six) hours as needed for mild pain.    Marland Kitchen HYDROcodone-acetaminophen (NORCO/VICODIN) 5-325 MG tablet Take 1-2 tablets by mouth every 4 (four) hours as needed. (Patient not taking: Reported on 08/30/2017) 20 tablet 0  . ibuprofen (ADVIL,MOTRIN) 800 MG tablet Take 1 tablet (800 mg total) by mouth every 8 (eight) hours as needed. (Patient not taking: Reported on 08/30/2017) 30 tablet 0   No current facility-administered medications on file prior to visit.     No Known Allergies  Social History   Socioeconomic History  . Marital status: Single    Spouse name: Not on file  . Number of children: Not on file  . Years of education: Not on file  . Highest education level: Not on file  Occupational History  . Not on file  Social Needs  .  Financial resource strain: Not on file  . Food insecurity:    Worry: Not on file    Inability: Not on file  . Transportation needs:    Medical: Not on file    Non-medical: Not on file  Tobacco Use  . Smoking status: Current Some Day Smoker    Packs/day: 0.25    Types: Cigarettes  . Smokeless tobacco: Never Used  Substance and Sexual Activity  . Alcohol use: Yes    Comment: occasionally  . Drug use: No  . Sexual activity: Not on file  Lifestyle  . Physical activity:    Days per week: Not on file    Minutes per session: Not on file  . Stress: Not on file  Relationships  . Social connections:    Talks on phone: Not on file    Gets together: Not on file    Attends religious service: Not on file    Active member of club or organization: Not on file    Attends meetings of clubs or organizations: Not on file    Relationship status: Not on file  . Intimate partner violence:    Fear of current or ex partner: Not on file    Emotionally abused: Not on file  Physically abused: Not on file    Forced sexual activity: Not on file  Other Topics Concern  . Not on file  Social History Narrative  . Not on file    No family history on file.  The following portions of the patient's history were reviewed and updated as appropriate: allergies, current medications, past family history, past medical history, past social history, past surgical history and problem list.  Review of Systems Pertinent items noted in HPI and remainder of comprehensive ROS otherwise negative.   Objective:  BP (!) 150/104 (BP Location: Right Arm, Patient Position: Sitting, Cuff Size: Large)   Pulse 70   Wt (!) 379 lb 9.6 oz (172.2 kg)   LMP 04/27/2017 (Approximate)   Breastfeeding? Unknown   BMI 51.48 kg/m  CONSTITUTIONAL: Well-developed, well-nourished female in no acute distress.  NECK: Normal range of motion, supple, no masses.  Normal thyroid.  SKIN: Skin is warm and dry. No rash noted. Not  diaphoretic. No erythema. No pallor. NEUROLOGIC: Alert and oriented to person, place, and time. No cranial nerve deficit noted. PSYCHIATRIC: Normal mood and affect. Normal behavior. Normal judgment and thought content. CARDIOVASCULAR: Normal heart rate noted, regular rhythm RESPIRATORY: Clear to auscultation bilaterally. Effort and breath sounds normal, no problems with respiration noted. BREASTS: Symmetric in size. No masses, skin changes, nipple drainage, or lymphadenopathy. ABDOMEN: Soft, normal bowel sounds, no distention noted.  No tenderness, rebound or guarding.  PELVIC: Normal appearing external genitalia; normal appearing vaginal mucosa and cervix.  Pap smear obtained.   MUSCULOSKELETAL: Normal range of motion. No tenderness.  No cyanosis, clubbing, or edema.  2+ distal pulses.   Assessment and Plan:   Will follow up results of pap smear and manage accordingly. -advised to follow up with PCP regarding elevated BP  -declined meeting with Roselyn Reef, says she has a strong support system at home -Flu shot today -check urine pregnancy test to ensure hcg has returned to normal -hep b, hep c, RPR, HIV today Routine preventative health maintenance measures emphasized. Please refer to After Visit Summary for other counseling recommendations.    Tor Netters, Medical Student

## 2017-08-31 LAB — POCT PREGNANCY, URINE: Preg Test, Ur: NEGATIVE

## 2017-08-31 LAB — HIV ANTIBODY (ROUTINE TESTING W REFLEX): HIV SCREEN 4TH GENERATION: NONREACTIVE

## 2017-08-31 LAB — HEPATITIS B SURFACE ANTIGEN: Hepatitis B Surface Ag: NEGATIVE

## 2017-08-31 LAB — HEPATITIS C ANTIBODY: Hep C Virus Ab: <0.1 s/co ratio (ref 0.0–0.9)

## 2017-08-31 LAB — RPR: RPR Ser Ql: NONREACTIVE

## 2017-09-03 LAB — CYTOLOGY - PAP
BACTERIAL VAGINITIS: POSITIVE — AB
CANDIDA VAGINITIS: NEGATIVE
CHLAMYDIA, DNA PROBE: NEGATIVE
Diagnosis: NEGATIVE
HPV (WINDOPATH): DETECTED — AB
NEISSERIA GONORRHEA: NEGATIVE
Trichomonas: POSITIVE — AB

## 2017-09-25 ENCOUNTER — Other Ambulatory Visit: Payer: Self-pay | Admitting: Obstetrics & Gynecology

## 2017-09-25 DIAGNOSIS — A5901 Trichomonal vulvovaginitis: Secondary | ICD-10-CM

## 2017-09-25 MED ORDER — METRONIDAZOLE 500 MG PO TABS: ORAL_TABLET | ORAL | 0 refills | Status: DC

## 2017-09-25 NOTE — Progress Notes (Signed)
f °

## 2017-10-23 ENCOUNTER — Encounter (HOSPITAL_COMMUNITY): Payer: Self-pay

## 2017-10-23 ENCOUNTER — Emergency Department (HOSPITAL_COMMUNITY): Payer: Medicaid Other

## 2017-10-23 ENCOUNTER — Emergency Department (HOSPITAL_COMMUNITY)
Admission: EM | Admit: 2017-10-23 | Discharge: 2017-10-23 | Disposition: A | Discharge status: Home / Self Care | Payer: Medicaid Other | Attending: Emergency Medicine | Admitting: Emergency Medicine

## 2017-10-23 DIAGNOSIS — S8392XA Sprain of unspecified site of left knee, initial encounter: Secondary | ICD-10-CM | POA: Diagnosis not present

## 2017-10-23 DIAGNOSIS — Y929 Unspecified place or not applicable: Secondary | ICD-10-CM | POA: Insufficient documentation

## 2017-10-23 DIAGNOSIS — Y939 Activity, unspecified: Secondary | ICD-10-CM | POA: Insufficient documentation

## 2017-10-23 DIAGNOSIS — S8992XA Unspecified injury of left lower leg, initial encounter: Secondary | ICD-10-CM | POA: Diagnosis present

## 2017-10-23 DIAGNOSIS — Y999 Unspecified external cause status: Secondary | ICD-10-CM | POA: Diagnosis not present

## 2017-10-23 DIAGNOSIS — X58XXXA Exposure to other specified factors, initial encounter: Secondary | ICD-10-CM | POA: Insufficient documentation

## 2017-10-23 NOTE — ED Notes (Signed)
Pt. Stated, I was stretching on Sunday and felt it on Monday my left knee in pain. No injury

## 2017-10-23 NOTE — ED Provider Notes (Signed)
Patient placed in Quick Look pathway, seen and evaluated   Chief Complaint: left knee pain  HPI:   Patient states she has been having progressively worsening pain to left knee for 3 days.  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  States pain is been progressively worsening, and is worse with ambulation.  Pain is located on the anterior lateral aspect.  States she is noticed some mild swelling.  No medications tried for symptoms.  No specific injuries noted.  Denies pain to the posterior of her knee or calf pain.  No history of DVT.  ROS: + knee pain, - calf pain  Physical Exam:   Gen: No distress  Neuro: Awake and Alert  Skin: Warm    Focused Exam: Pt with limp favoring left. Pt is morbidly obese. Mild TTP to anterolateral aspect of knee, no erythema or increased warmth. Unable to assess ligaments 2/t patient's body habitus. No TTP to posterior knee or calf.    Initiation of care has begun. The patient has been counseled on the process, plan, and necessity for staying for the completion/evaluation, and the remainder of the medical screening examination    Robinson, [place] N, PA-C 10/23/17 1633    Blanchie Dessert, MD 10/23/17 2251

## 2017-10-23 NOTE — Progress Notes (Signed)
Orthopedic Tech Progress Note Patient Details:  Jill Stevenson [birth year] [id number]  Ortho Devices Type of Ortho Device: Ace wrap, Crutches Ortho Device/Splint Location: LLE Ortho Device/Splint Interventions: Ordered, Application, Adjustment   Post Interventions Patient Tolerated: Well Instructions Provided: Care of device   Braulio Bosch 10/23/2017, 7:09 PM

## 2017-10-23 NOTE — ED Triage Notes (Signed)
Pt presents with 3 day h/o L knee pain.  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  Pt denies any pain behind her knee or calf.

## 2017-10-23 NOTE — ED Provider Notes (Signed)
[facility] EMERGENCY DEPARTMENT Provider Note   CSN: [id number] Arrival date & time: 10/23/17  1444   History   Chief Complaint Chief Complaint  Patient presents with  . Leg Pain    HPI Jill Stevenson is a 37 y.o. female.  HPI    37 year old female presents today with complaints of left knee pain.  Patient notes that days ago she was laying on her bed with her legs outstretched.  She notes she went to sit up and felt a pain in the left lateral knee.  She notes that this is progressively worsened over the last several days, pain with ambulation, she notes some minor swelling yesterday.  She denies any decreased range of motion but notes that when she leaves her knee in a flexed position for prolonged period of time it hurts to extend it.  She denies any warmth to touch, any trauma to her knee or any previous history of knee problems.     Past Medical History:  Diagnosis Date  . Medical history non-contributory     There are no active problems to display for this patient.   Past Surgical History:  Procedure Laterality Date  . CESAREAN SECTION       OB History    Gravida  3   Para  2   Term  2   Preterm      AB      Living  2     SAB      TAB      Ectopic      Multiple      Live Births               Home Medications    Prior to Admission medications   Medication Sig Start Date End Date Taking? Authorizing Provider  acetaminophen (TYLENOL) 500 MG tablet Take 500 mg by mouth every 6 (six) hours as needed for mild pain.    [provider]  HYDROcodone-acetaminophen (NORCO/VICODIN) 5-325 MG tablet Take 1-2 tablets by mouth every 4 (four) hours as needed. Patient not taking: Reported on 08/30/2017 07/17/17   Tresea Mall, CNM  ibuprofen (ADVIL,MOTRIN) 800 MG tablet Take 1 tablet (800 mg total) by mouth every 8 (eight) hours as needed. Patient not taking: Reported on 08/30/2017 07/17/17   Marcille Buffy D, CNM  metroNIDAZOLE  (FLAGYL) 500 MG tablet Take two tablets by mouth twice a day, for one day.  Or you can take all four tablets at once if you can tolerate it. 09/25/17   Woodroe Mode, MD    Family History Family History  Problem Relation Age of Onset  . Breast cancer Maternal Aunt     Social History Social History   Tobacco Use  . Smoking status: Current Some Day Smoker    Packs/day: 0.25    Types: Cigarettes  . Smokeless tobacco: Never Used  Substance Use Topics  . Alcohol use: Yes    Comment: occasionally  . Drug use: No     Allergies   Patient has no known allergies.   Review of Systems Review of Systems  All other systems reviewed and are negative.    Physical Exam Updated Vital Signs BP (!) 158/104 (BP Location: Right Arm)   Pulse 86   Temp 99.8 F (37.7 C) (Oral)   Resp 18   Ht 6' (1.829 m)   Wt (!) 158.8 kg (350 lb)   LMP 09/25/2017 (Approximate)   SpO2 98%  BMI 47.47 kg/m   Physical Exam  Constitutional: She is oriented to person, place, and time. She appears well-developed and well-nourished.  HENT:  Head: Normocephalic and atraumatic.  Eyes: Pupils are equal, round, and reactive to light. Conjunctivae are normal. Right eye exhibits no discharge. Left eye exhibits no discharge. No scleral icterus.  Neck: Normal range of motion. No JVD present. No tracheal deviation present.  Pulmonary/Chest: Effort normal. No stridor.  Musculoskeletal:  Left knee atraumatic no swelling or edema, no warmth to touch, minor tenderness along the lateral joint line and lateral collateral ligament, no significant laxity, full active range of motion  Neurological: She is alert and oriented to person, place, and time. Coordination normal.  Psychiatric: She has a normal mood and affect. Her behavior is normal. Judgment and thought content normal.  Nursing note and vitals reviewed.    ED Treatments / Results  Labs (all labs ordered are listed, but only abnormal results are  displayed) Labs Reviewed - No data to display  EKG None  Radiology Dg Knee Complete 4 Views Left  Result Date: 10/23/2017 CLINICAL DATA:  Knee pain.  Hyperextension. EXAM: LEFT KNEE - COMPLETE 4+ VIEW COMPARISON:  No recent. FINDINGS: No acute bony or joint abnormality identified. Tricompartment degenerative change. No evidence of fracture dislocation. Benign-appearing most likely dystrophic soft tissue calcifications noted adjacent to the patella. IMPRESSION: Tricompartment degenerative change.  No acute abnormality. Electronically Signed   By: Marcello Moores  Register   On: 10/23/2017 17:15    Procedures Procedures (including critical care time)  Medications Ordered in ED Medications - No data to display   Initial Impression / Assessment and Plan / ED Course  I have reviewed the triage vital signs and the nursing notes.  Pertinent labs & imaging results that were available during my care of the patient were reviewed by me and considered in my medical decision making (see chart for details).     37 year old female presents today with complaints of knee pain.  She likely has a sprain to her left knee, no signs of infectious etiology, reassuring imaging here.  Patient will be discharged with symptomatic care, strict return precautions and follow-up information.  She verbalized understanding and agreement to today's plan had no further questions or concerns the time discharge.  Final Clinical Impressions(s) / ED Diagnoses   Final diagnoses:  Sprain of left knee, unspecified ligament, initial encounter    ED Discharge Orders    None       Francee Gentile 10/23/17 1905    Valarie Merino, MD 10/23/17 2251

## 2017-10-23 NOTE — Discharge Instructions (Signed)
Please read attached information. If you experience any new or worsening signs or symptoms please return to the emergency room for evaluation. Please follow-up with your primary care provider or specialist as discussed.  °

## 2018-12-13 ENCOUNTER — Ambulatory Visit: Payer: Self-pay | Admitting: Obstetrics & Gynecology

## 2019-01-02 ENCOUNTER — Ambulatory Visit (INDEPENDENT_AMBULATORY_CARE_PROVIDER_SITE_OTHER): Payer: Medicaid Other | Admitting: Obstetrics & Gynecology

## 2019-01-02 ENCOUNTER — Other Ambulatory Visit (HOSPITAL_COMMUNITY)
Admission: RE | Admit: 2019-01-02 | Discharge: 2019-01-02 | Disposition: A | Discharge status: Home / Self Care | Payer: Medicaid Other | Source: Ambulatory Visit | Attending: Obstetrics & Gynecology | Admitting: Obstetrics & Gynecology

## 2019-01-02 ENCOUNTER — Encounter: Payer: Self-pay | Admitting: Obstetrics & Gynecology

## 2019-01-02 ENCOUNTER — Other Ambulatory Visit: Payer: Self-pay

## 2019-01-02 VITALS — BP 137/97 | HR 89 | Ht 72.0 in | Wt 378.0 lb

## 2019-01-02 DIAGNOSIS — I10 Essential (primary) hypertension: Secondary | ICD-10-CM

## 2019-01-02 DIAGNOSIS — Z01419 Encounter for gynecological examination (general) (routine) without abnormal findings: Secondary | ICD-10-CM | POA: Diagnosis present

## 2019-01-02 DIAGNOSIS — Z0000 Encounter for general adult medical examination without abnormal findings: Secondary | ICD-10-CM | POA: Diagnosis not present

## 2019-01-02 DIAGNOSIS — Z3049 Encounter for surveillance of other contraceptives: Secondary | ICD-10-CM

## 2019-01-02 MED ORDER — ETONOGESTREL-ETHINYL ESTRADIOL 0.12-0.015 MG/24HR VA RING: VAGINAL_RING | VAGINAL | 12 refills | Status: DC

## 2019-01-02 NOTE — Progress Notes (Signed)
Patient ID: Jill Stevenson, female   DOB: 01/27/1981, 38 y.o.   MRN: [id number]  Chief Complaint  Patient presents with  . Gynecologic Exam  wants to restart NuvaRing. She has regular menses but with cramps  HPI Jill Stevenson is a 38 y.o. female.  [id number] Patient's last menstrual period was 11/30/2018 (within days). 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  HPI  Past Medical History:  Diagnosis Date  . Medical history non-contributory     Past Surgical History:  Procedure Laterality Date  . CESAREAN SECTION      Family History  Problem Relation Age of Onset  . Breast cancer Maternal Aunt     Social History Social History   Tobacco Use  . Smoking status: Current Some Day Smoker    Packs/day: 0.25    Types: Cigarettes  . Smokeless tobacco: Never Used  Substance Use Topics  . Alcohol use: Yes    Comment: occasionally  . Drug use: No    No Known Allergies  Current Outpatient Medications  Medication Sig Dispense Refill  . phentermine 37.5 MG capsule Take 37.5 mg by mouth every morning.    . etonogestrel-ethinyl estradiol (NUVARING) 0.12-0.015 MG/24HR vaginal ring Insert vaginally and leave in place for 3 consecutive weeks, then remove for 1 week. 1 each 12   No current facility-administered medications for this visit.     Review of Systems Review of Systems  Constitutional: Negative.   Respiratory: Negative.   Gastrointestinal: Negative.   Genitourinary: Positive for menstrual problem (cramps). Negative for vaginal bleeding and vaginal discharge.    Blood pressure (!) 137/97, pulse 89, height 6' (1.829 m), weight (!) 378 lb (171.5 kg), last menstrual period 11/30/2018, unknown if currently breastfeeding.  Physical Exam Physical Exam Constitutional:      Appearance: She is obese. She is not ill-appearing.  Eyes:     Pupils: Pupils are equal, round, and reactive to light.  Neck:     Musculoskeletal: Normal range of motion.  Cardiovascular:   Rate and Rhythm: Normal rate.  Pulmonary:     Effort: Pulmonary effort is normal.     Breath sounds: Normal breath sounds.  Abdominal:     General: Abdomen is flat.     Palpations: Abdomen is soft.     Tenderness: There is no abdominal tenderness.  Genitourinary:    General: Normal vulva.     Comments: Pelvic exam: normal external genitalia, vulva, vagina, cervix, uterus and adnexa.  Neurological:     Mental Status: She is alert.   Breasts: breasts appear normal, no suspicious masses, no skin or nipple changes or axillary nodes.   Data Reviewed Pap result  Assessment Obese Needs [facility] F/u abnormal HPV screen Plan  Yearly exam IF pap is normal f/u as indicated Nuv[facility] Rx   Emeterio Reeve 01/02/2019, 12:09 PM

## 2019-01-02 NOTE — Patient Instructions (Signed)
Ethinyl Estradiol; Etonogestrel vaginal ring What is this medicine? ETHINYL ESTRADIOL; ETONOGESTREL (ETH in il es tra DYE ole; et oh noe JES trel) vaginal ring is a flexible, vaginal ring used as a contraceptive (birth control method). This medicine combines 2 types of female hormones, an estrogen and a progestin. This ring is used to prevent ovulation and pregnancy. Each ring is effective for 1 month. This medicine may be used for other purposes; ask your health care provider or pharmacist if you have questions. COMMON BRAND NAME(S): EluRyng, NuvaRing What should I tell my health care provider before I take this medicine? They need to know if you have any of these conditions:  abnormal vaginal bleeding  blood vessel disease or blood clots  breast, cervical, endometrial, ovarian, liver, or uterine cancer  diabetes  gallbladder disease  having surgery  heart disease or recent heart attack  high blood pressure  high cholesterol or triglycerides  history of irregular heartbeat or heart valve problems  kidney disease  liver disease  migraine headaches  protein C deficiency  protein S deficiency  recently had a baby, miscarriage, or abortion  stroke  systemic lupus erythematosus (SLE)  tobacco smoker  your age is more than 38 years old  an unusual or allergic reaction to estrogens, progestins, other medicines, foods, dyes, or preservatives  pregnant or trying to get pregnant  breast-feeding How should I use this medicine? Insert the ring into your vagina as directed. Follow the directions on the prescription label. The ring will remain place for 3 weeks and is then removed for a 1-week break. A new ring is inserted 1 week after the last ring was removed, on the same day of the week. Check often to make sure the ring is still in place. If the ring was out of the vagina for an unknown amount of time, you may not be protected from pregnancy. Perform a pregnancy test and  call your doctor. Do not use more often than directed. A patient package insert for the product will be given with each prescription and refill. Read this sheet carefully each time. The sheet may change frequently. Contact your pediatrician regarding the use of this medicine in children. Special care may be needed. Overdosage: If you think you have taken too much of this medicine contact a poison control center or emergency room at once. NOTE: This medicine is only for you. Do not share this medicine with others. What if I miss a dose? You will need to use the ring exactly as directed. It is very important to follow the schedule every cycle. If you do not use the ring as directed, you may not be protected from pregnancy. If the ring should slip out, is lost, or if you leave it in longer or shorter than you should, contact your health care professional for advice. What may interact with this medicine? Do not take this medicine with the following medications:  dasabuvir; ombitasvir; paritaprevir; ritonavir  ombitasvir; paritaprevir; ritonavir  vaginal lubricants or other vaginal products that are oil-based or silicone-based This medicine may also interact with the following medications:  acetaminophen  antibiotics or medicines for infections, especially rifampin, rifabutin, rifapentine, and griseofulvin, and possibly penicillins or tetracyclines  aprepitant or fosaprepitant  armodafinil  ascorbic acid (vitamin C)  barbiturate medicines, such as phenobarbital or primidone  bosentan  certain antiviral medicines for hepatitis, HIV or AIDS  certain medicines for cancer treatment  certain medicines for seizures like carbamazepine, clobazam, felbamate, lamotrigine, oxcarbazepine, phenytoin,  rufinamide, topiramate  certain medicines for treating high cholesterol  cyclosporine  dantrolene  elagolix  flibanserin  grapefruit juice  lesinurad  medicines for diabetes  medicines  to treat fungal infections, such as griseofulvin, miconazole, fluconazole, ketoconazole, itraconazole, posaconazole or voriconazole  mifepristone  mitotane  modafinil  morphine  mycophenolate  St. John's wort  tamoxifen  temazepam  theophylline or aminophylline  thyroid hormones  tizanidine  tranexamic acid  ulipristal  warfarin This list may not describe all possible interactions. Give your health care provider a list of all the medicines, herbs, non-prescription drugs, or dietary supplements you use. Also tell them if you smoke, drink alcohol, or use illegal drugs. Some items may interact with your medicine. What should I watch for while using this medicine? Visit your doctor or health care professional for regular checks on your progress. You will need a regular breast and pelvic exam and Pap smear while on this medicine. Check with your doctor or health care professional to see if you need an additional method of contraception during the first cycle that you use this ring. Female condoms (made with natural rubber latex, polyisoprene, and polyurethane) and spermicides may be used. Do not use a diaphragm, cervical cap, or a female condom, as the ring can interfere with these birth control methods and their proper placement. If you have any reason to think you are pregnant, stop using this medicine right away and contact your doctor or health care professional. If you are using this medicine for hormone related problems, it may take several cycles of use to see improvement in your condition. Smoking increases the risk of getting a blood clot or having a stroke while you are using hormonal birth control, especially if you are more than 38 years old. You are strongly advised not to smoke. Some women are prone to getting dark patches on the skin of the face (cholasma). Your risk of getting chloasma with this medicine is higher if you had chloasma during a pregnancy. Keep out of the  sun. If you cannot avoid being in the sun, wear protective clothing and use sunscreen. Do not use sun lamps or tanning beds/booths. This medicine can make your body retain fluid, making your fingers, hands, or ankles swell. Your blood pressure can go up. Contact your doctor or health care professional if you feel you are retaining fluid. If you are going to have elective surgery, you may need to stop using this medicine before the surgery. Consult your health care professional for advice. This medicine does not protect you against HIV infection (AIDS) or any other sexually transmitted diseases. What side effects may I notice from receiving this medicine? Side effects that you should report to your doctor or health care professional as soon as possible:  allergic reactions such as skin rash or itching, hives, swelling of the lips, mouth, tongue, or throat  depression  high blood pressure  migraines or severe, sudden headaches  signs and symptoms of a blood clot such as breathing problems; changes in vision; chest pain; severe, sudden headache; pain, swelling, warmth in the leg; trouble speaking; sudden numbness or weakness of the face, arm or leg  signs and symptoms of infection like fever or chills with dizziness and a sunburn-like rash, or pain or trouble passing urine  stomach pain  symptoms of vaginal infection like itching, irritation or unusual discharge  yellowing of the eyes or skin Side effects that usually do not require medical attention (report these to your doctor   or health care professional if they continue or are bothersome):  acne  breast pain, tenderness  irregular vaginal bleeding or spotting, particularly during the first month of use  mild headache  nausea  painful periods  vomiting This list may not describe all possible side effects. Call your doctor for medical advice about side effects. You may report side effects to FDA at 1-800-FDA-1088. Where should I  keep my medicine? Keep out of the reach of children. Store unopened rings in the original foil pouch at room temperature between 20 and 25 degrees C (68 and 77 degrees F) for up to 4 months. Protect from light. Do not store above 30 degrees C (86 degrees F). Throw away any unused medicine after the expiration date. A ring may only be used for 1 cycle (1 month). After the 3-week cycle, a used ring is removed and should be placed in the re-closable foil pouch and discarded in the trash out of reach of children and pets. Do NOT flush down the toilet. NOTE: This sheet is a summary. It may not cover all possible information. If you have questions about this medicine, talk to your doctor, pharmacist, or health care provider.  2020 [employer]/Gold Standard (2017-01-12 14:41:10)

## 2019-01-03 LAB — RPR: RPR Ser Ql: NONREACTIVE

## 2019-01-03 LAB — HEPATITIS C ANTIBODY: Hep C Virus Ab: <0.1 s/co ratio (ref 0.0–0.9)

## 2019-01-03 LAB — HIV ANTIBODY (ROUTINE TESTING W REFLEX): HIV Screen 4th Generation wRfx: NONREACTIVE

## 2019-01-03 LAB — HEPATITIS B SURFACE ANTIGEN: Hepatitis B Surface Ag: NEGATIVE

## 2019-01-06 LAB — CYTOLOGY - PAP
Chlamydia: NEGATIVE
Diagnosis: NEGATIVE
HPV: NOT DETECTED
Neisseria Gonorrhea: NEGATIVE
Trichomonas: NEGATIVE

## 2019-01-20 ENCOUNTER — Encounter (INDEPENDENT_AMBULATORY_CARE_PROVIDER_SITE_OTHER): Payer: Self-pay | Admitting: Primary Care

## 2019-01-20 ENCOUNTER — Telehealth (INDEPENDENT_AMBULATORY_CARE_PROVIDER_SITE_OTHER): Payer: Medicaid Other | Admitting: Primary Care

## 2019-01-20 ENCOUNTER — Other Ambulatory Visit: Payer: Self-pay

## 2019-01-20 DIAGNOSIS — M545 Low back pain, unspecified: Secondary | ICD-10-CM

## 2019-01-20 DIAGNOSIS — F17200 Nicotine dependence, unspecified, uncomplicated: Secondary | ICD-10-CM | POA: Diagnosis not present

## 2019-01-20 DIAGNOSIS — Z7689 Persons encountering health services in other specified circumstances: Secondary | ICD-10-CM

## 2019-01-20 DIAGNOSIS — I10 Essential (primary) hypertension: Secondary | ICD-10-CM

## 2019-01-20 MED ORDER — HYDROCHLOROTHIAZIDE 25 MG PO TABS
25.0000 mg | ORAL_TABLET | Freq: Every day | ORAL | 3 refills | Status: DC
Start: 2019-01-20 — End: 2019-03-27

## 2019-01-20 NOTE — Progress Notes (Signed)
Pt complains of intermittent lower back and thigh pain

## 2019-01-20 NOTE — Progress Notes (Signed)
Virtual Visit via Telephone Note  I connected with Jill Stevenson on 01/20/19 at  3:10 PM EDT by telephone and verified that I am speaking with the correct person using two identifiers.   I discussed the limitations, risks, security and privacy concerns of performing an evaluation and management service by telephone and the availability of in person appointments. I also discussed with the patient that there may be a patient responsible charge related to this service. The patient expressed understanding and agreed to proceed. TELE unable to connect to web  History of Present Illness: [name] is establishing care having difficulty connecting via Web visit changed to Tele . She works a Educational psychologist, bending and moving patients. Her complaint is lower back pain that travels down to her thigh- repetitive motion, muscle strain or a pinch nerve. Review notes Bp remains elevated . She denies shortness of breath, headaches, chest pain or lower extremity edema. Followed by a suboxone clinic and any pain mangment will need to be prescribe by pain management.   Past Medical History:  Diagnosis Date  . Medical history non-contributory     Observations/Objective: Review of Systems  Musculoskeletal:       Thigh pain     Assessment and Plan: Jill Stevenson was seen today for new patient (initial visit) and weight loss.  Diagnoses and all orders for this visit:  Acute bilateral low back pain, unspecified whether sciatica present Work on losing weight to help reduce back pain. May alternate with heat and ice application for pain relief. May also alternate with acetaminophen and Ibuprofen as prescribed pain relief. Other alternatives include massage, acupuncture and water aerobics.  You must stay active and avoid a sedentary lifestyle. -     Ambulatory referral to Orthopedic Surgery  Establishing care with new doctor, encounter for Ms. works is establishing care. Her occupation is a caregiver  which  lifting, bending and moving patients. Her complaint is lower back pain that travels down to her thigh- repetitive motion, muscle strain or a pinch nerve  Smoking Nicotine affect every organ in the body second leading cause of death.  Increased risk for lung cancer and other respiratory diseases recommend cessation.  This will be reminded at each clinical visit.  Class 3 severe obesity due to excess calories in adult, unspecified BMI, unspecified whether serious comorbidity present (Jill Stevenson) Morbid Obesity is  indicating an excess in caloric intake or underlining conditions. This may lead to other co-morbidities. Lifestyle modifications of diet and exercise may reduce obesity.   Essential hypertension Counseled on blood pressure goal of less than 130/80, low-sodium, DASH diet, medication compliance, 150 minutes of moderate intensity exercise per week. Discussed medication compliance, adverse effects.  Other orders -     hydrochlorothiazide (HYDRODIURIL) 25 MG tablet; Take 1 tablet (25 mg total) by mouth daily.    Follow Up Instructions:    I discussed the assessment and treatment plan with the patient. The patient was provided an opportunity to ask questions and all were answered. The patient agreed with the plan and demonstrated an understanding of the instructions.   The patient was advised to call back or seek an in-person evaluation if the symptoms worsen or if the condition fails to improve as anticipated.  I provided 26  minutes of non-face-to-face time during this encounter.   Kerin Perna, NP

## 2019-01-28 ENCOUNTER — Ambulatory Visit: Payer: Medicaid Other | Admitting: Family Medicine

## 2019-02-04 ENCOUNTER — Ambulatory Visit (INDEPENDENT_AMBULATORY_CARE_PROVIDER_SITE_OTHER): Payer: Medicaid Other | Admitting: Family Medicine

## 2019-02-04 ENCOUNTER — Ambulatory Visit (INDEPENDENT_AMBULATORY_CARE_PROVIDER_SITE_OTHER): Payer: Medicaid Other

## 2019-02-04 ENCOUNTER — Encounter: Payer: Self-pay | Admitting: Family Medicine

## 2019-02-04 DIAGNOSIS — M5441 Lumbago with sciatica, right side: Secondary | ICD-10-CM

## 2019-02-04 DIAGNOSIS — M5442 Lumbago with sciatica, left side: Secondary | ICD-10-CM | POA: Diagnosis not present

## 2019-02-04 MED ORDER — BACLOFEN 10 MG PO TABS: 5.0000 mg | ORAL_TABLET | Freq: Three times a day (TID) | ORAL | 1 refills | Status: DC | PRN

## 2019-02-04 MED ORDER — GABAPENTIN 100 MG PO CAPS: ORAL_CAPSULE | ORAL | 3 refills | Status: DC

## 2019-02-04 NOTE — Progress Notes (Signed)
I saw and examined the patient with Dr. Mayer Masker and agree with assessment and plan as outlined.  Pain in SI area on both sides.  Obesity likely contributing.  Will try PT, meds.  Consider MRI scan or SI injection if fails to improve.

## 2019-02-04 NOTE — Progress Notes (Signed)
Jill Stevenson - 38 y.o. female MRN [id number]  Date of birth: 12-25-1980  Office Visit Note: Visit Date: 02/04/2019 PCP: [facility] Referred by: Kerin Perna, NP  Subjective: Chief Complaint  Patient presents with  . Lower Back - Pain    Pain across lower back and down into the thighs. Started in April of this year. Has gotten worse. 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 Short-term relief with stretching.   HPI: Jill Stevenson is a 38 y.o. female who comes in today with 5 months of lower back pain. She does not recall an inciting event or injury but she does work as a Programmer, applications and lifts heavy things often. Reports bilateral lower back pain with pain down the back of her legs. No numbness/tingling. Does feel like her legs are weaker and at the end of the day she needs to take a break.   ROS Otherwise per HPI.  Assessment & Plan: Visit Diagnoses:  1. Bilateral low back pain with bilateral sciatica, unspecified chronicity   Plain films of lumbar spine are unrevealing. Exam indicates SI joint dysfunction. Will trial PT with gabapentin for neuropathic pain and baclofen for muscle spasms. If no improvement, will return for MRI of spine.  Meds & Orders:  Meds ordered this encounter  Medications  . baclofen (LIORESAL) 10 MG tablet    Sig: Take 0.5-1 tablets (5-10 mg total) by mouth 3 (three) times daily as needed for muscle spasms.    Dispense:  30 each    Refill:  1  . gabapentin (NEURONTIN) 100 MG capsule    Sig: 1 PO q HS, may increase to 1 PO TID if needed.    Dispense:  90 capsule    Refill:  3    Orders Placed This Encounter  Procedures  . XR Lumbar Spine 2-3 Views  . Ambulatory referral to Physical Therapy    Follow-up: No follow-ups on file.   Procedures: No procedures performed  No notes on file   Clinical History: No specialty comments available.   She reports that she has been smoking cigarettes. She has  been smoking about 0.25 packs per day. She has never used smokeless tobacco. No results for input(s): HGBA1C, LABURIC in the last 8760 hours.  Objective:  VS:  HT:    WT:   BMI:     BP:   HR: bpm  TEMP: ( )  RESP:  Physical Exam  PHYSICAL EXAM: Gen: NAD, alert, cooperative with exam, obese, tall woman HEENT: clear conjunctiva,  CV:  no edema, capillary refill brisk, normal rate Resp: non-labored Skin: no rashes, normal turgor  Neuro: no gross deficits.  Psych:  alert and oriented  Ortho Exam  Lumbar spine: - Inspection: no gross deformity or asymmetry, swelling or ecchymosis - Palpation: TTP over L5-S1 spinous processes, with greater TTP along bilateral SI joints - ROM: limited lumbar spine flexion and extension due to pain - Strength: 5/5 strength of lower extremity in L4-S1 nerve root distributions b/l; normal gait - Neuro: sensation intact in the L4-S1 nerve root distribution b/l, 2+ L4 and S1 reflexes - Special testing: Negative straight leg raise, positive Stork test bilaterally, positive FABER bilaterally  Imaging: Lumbar spine with no obvious deformity  Past Medical/Family/Surgical/Social History: Medications & Allergies reviewed per EMR, new medications updated. There are no active problems to display for this patient.  Past Medical History:  Diagnosis Date  . Medical history non-contributory  Family History  Problem Relation Age of Onset  . Breast cancer Maternal Aunt    Past Surgical History:  Procedure Laterality Date  . CESAREAN SECTION     Social History   Occupational History  . Not on file  Tobacco Use  . Smoking status: Current Some Day Smoker    Packs/day: 0.25    Types: Cigarettes  . Smokeless tobacco: Never Used  Substance and Sexual Activity  . Alcohol use: Yes    Comment: occasionally  . Drug use: No  . Sexual activity: Not on file

## 2019-02-06 ENCOUNTER — Encounter: Payer: Self-pay | Admitting: Physical Therapy

## 2019-02-06 ENCOUNTER — Ambulatory Visit: Payer: Medicaid Other | Attending: Family Medicine | Admitting: Physical Therapy

## 2019-02-06 ENCOUNTER — Other Ambulatory Visit: Payer: Self-pay

## 2019-02-06 DIAGNOSIS — M5417 Radiculopathy, lumbosacral region: Secondary | ICD-10-CM | POA: Diagnosis present

## 2019-02-06 DIAGNOSIS — M5441 Lumbago with sciatica, right side: Secondary | ICD-10-CM | POA: Diagnosis present

## 2019-02-06 DIAGNOSIS — Z202 Contact with and (suspected) exposure to infections with a predominantly sexual mode of transmission: Secondary | ICD-10-CM | POA: Diagnosis present

## 2019-02-06 DIAGNOSIS — Z5321 Procedure and treatment not carried out due to patient leaving prior to being seen by health care provider: Secondary | ICD-10-CM | POA: Diagnosis not present

## 2019-02-06 DIAGNOSIS — M5442 Lumbago with sciatica, left side: Secondary | ICD-10-CM | POA: Diagnosis not present

## 2019-02-06 DIAGNOSIS — M62830 Muscle spasm of back: Secondary | ICD-10-CM | POA: Diagnosis present

## 2019-02-06 NOTE — Therapy (Signed)
[place], [place], [address] Phone: [phone]   Fax:  [phone]  Physical Therapy Evaluation  Patient Details  Name: Jill Stevenson MRN: [id number] Date of Birth: Jan 06, 1981 Referring Provider (PT): Dr Eunice Blase    Encounter Date: 02/06/2019  PT End of Session - 02/06/19 1613    Visit Number  1    Number of Visits  4    Date for PT Re-Evaluation  03/06/19    Authorization Type  MCD    PT Start Time  [id number]   Patient 7 min late. Therapy reviewed attendence policy for future visits.   PT Stop Time  1503    PT Time Calculation (min)  40 min       Past Medical History:  Diagnosis Date  . Medical history non-contributory     Past Surgical History:  Procedure Laterality Date  . CESAREAN SECTION      There were no vitals filed for this visit.   Subjective Assessment - 02/06/19 1428    Subjective  Patient began having low back pain in April. 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 She doesent remeber doing anything that could have hurt her back. She just began having pain. The pain is increasing. It is now going down to the back of her leg.    Pertinent History  Obesity,    Limitations  Standing;Walking    How long can you stand comfortably?  about a half hour before she feels pain    How long can you walk comfortably?  after a while the back pain incresases    Diagnostic tests  X-ray: preserved disc space    Patient Stated Goals  Less pain    Currently in Pain?  Yes    Pain Score  8     Pain Location  Back    Pain Orientation  Right    Pain Descriptors / Indicators  Aching    Pain Type  Chronic pain    Pain Radiating Towards  Pain radiates down the right leg    Pain Onset  More than a month ago    Pain Frequency  Constant    Aggravating Factors   Standing and walking    Pain Relieving Factors  Nothing, Medication         [facility] PT Assessment - 02/06/19 0001       Assessment   Medical Diagnosis  Low Back Pain with Right Radiculopthy     Referring Provider (PT)  Dr Eunice Blase     Next MD Visit  Nothing at this time.     Prior Therapy  Nothing       Precautions   Precautions  None      Restrictions   Weight Bearing Restrictions  No      Balance Screen   Has the patient fallen in the past 6 months  No    Has the patient had a decrease in activity level because of a fear of falling?   No    Is the patient reluctant to leave their home because of a fear of falling?   No      Home Environment   Living Environment  Private residence    Additional Comments  Steps into the house that increase pain       Prior Function   Level of Independence  Independent    Vocation  Full time employment    Vocation  Requirements  Works as a Quarry manager     Leisure  Walking; Chief Operating Officer   Overall Cognitive Status  Within [employer] for tasks assessed    Attention  Focused    Focused Attention  Appears intact    Memory  Appears intact    Awareness  Appears intact    Problem Solving  Appears intact      Observation/Other Assessments   Observations  sits in anterior pelvic tilt       Sensation   Additional Comments  pain radiates down into the left legf      Coordination   Gross Motor Movements are Fluid and Coordinated  Yes    Fine Motor Movements are Fluid and Coordinated  Yes      Posture/Postural Control   Posture Comments  rounded shoulders; anterior pelvic tilt ; hyper lordosis ,       ROM / Strength   AROM / PROM / Strength  PROM;AROM;Strength      AROM   AROM Assessment Site  Lumbar    Lumbar Flexion  limited 25% with pain     Lumbar Extension  limited 75% with increased pain     Lumbar - Right Side Bend  limited 50% with pain     Lumbar - Left Side Bend  no limit    Lumbar - Right Rotation  Linmited 50% with pain     Lumbar - Left Rotation  No limit       PROM   Overall PROM Comments  significant pain on the bilateral R> L  with internal and external rotation internal > externall       Strength   Overall Strength Comments  limited core stability     Strength Assessment Site  Lumbar;Hip;Knee    Right/Left Hip  Right;Left    Right Hip Flexion  4+/5    Right Hip ABduction  4/5    Left Hip Flexion  4+/5    Left Hip ABduction  4+/5    Right/Left Knee  Right;Left    Right Knee Flexion  5/5    Right Knee Extension  5/5    Left Knee Flexion  5/5    Left Knee Extension  5/5      Ambulation/Gait   Gait Comments  decreased hip flexion with gait.                 Objective measurements completed on examination: See above findings.              PT Education - 02/06/19 1433    Education Details  symptom mnagement, POC, HEP    Person(s) Educated  Patient    Methods  Explanation;Demonstration    Comprehension  Verbalized understanding;Returned demonstration;Verbal cues required;Tactile cues required       PT Short Term Goals - 02/06/19 1552      PT SHORT TERM GOAL #1   Title  Patient will report pain centralized in her lower back without radating pain into the right leg    Baseline  Pain that rotates to her mid posterior thigh    Time  4    Period  Weeks    Status  New    Target Date  03/06/19      PT SHORT TERM GOAL #2   Title  Patient will rotate to the right without increased painn    Baseline  can not perfrom hygiene tasks rotating to right  Time  4    Period  Weeks    Status  New    Target Date  03/06/19      PT SHORT TERM GOAL #3   Title  Patient will report no pain with passive internal and external rotation    Baseline  significant increase in pain    Time  4    Period  Weeks    Status  New    Target Date  03/06/19                Plan - 02/06/19 1517    Clinical Impression Statement  Patient is a 38 year old with lumbar/scaral pain that radiates into her right leg. She has increased pain with hip interna and external rotation R>L. She has increased pain  with right rotation which is limiting some functional activity. She has significant tenderness to palpation in her right SI joint. Her weight is likley plying a factor but she is having difficulty wlaking and dosent feel like she can go back to the gym because of her pain. She would benefit from further skilled therapy to decrease acute SI inflammation and to get her on a strengthening and general fitness program.    Personal Factors and Comorbidities  Comorbidity 1    Comorbidities  obesity    Examination-Activity Limitations  Locomotion Level;Sleep;Squat;Stairs;Stand    Examination-Participation Restrictions  Cleaning;Meal Prep;Community Activity;Laundry    Clinical Decision Making  Moderate    Rehab Potential  Good    PT Frequency  1x / week   per mediciad guidlines   PT Duration  3 weeks    PT Treatment/Interventions  ADLs/Self Care Home Management;Cryotherapy;Electrical Stimulation;Iontophoresis 4mg /ml Dexamethasone;Moist Heat;Ultrasound;DME Instruction;Gait training;Stair training;Functional mobility training;Therapeutic activities;Therapeutic exercise;Patient/family education;Manual techniques;Passive range of motion;Dry needling;Splinting;Taping    PT Next Visit Plan  consdier sacral METS. Unable to tell if there is an inflare or outflare because of large habitus, soft tissue mobilization to SI and lumbar aprea; LAD; begin core strenghtening. Sits in APT    PT Home Exercise Plan  lower trunk rotation, seated hamstring stretch; trigger point release to gluteals       Patient will benefit from skilled therapeutic intervention in order to improve the following deficits and impairments:  Abnormal gait, Difficulty walking, Decreased range of motion, Decreased endurance, Increased fascial restricitons, Impaired perceived functional ability, Decreased mobility, Decreased strength, Improper body mechanics  Visit Diagnosis: Bilateral low back pain with bilateral sciatica, unspecified  chronicity  Radiculopathy, lumbosacral region  Muscle spasm of back     Problem List There are no active problems to display for this patient.   Carney Living PT DPT  02/06/2019, 4:17 PM  [facility] [address] [place], [place], [address] Phone: [phone]   Fax:  [phone]  Name: Jill Stevenson MRN: [id number] Date of Birth: 1980-06-21

## 2019-02-07 ENCOUNTER — Ambulatory Visit (HOSPITAL_COMMUNITY)
Admission: EM | Admit: 2019-02-07 | Discharge: 2019-02-07 | Disposition: A | Discharge status: Home / Self Care | Payer: Medicaid Other | Attending: Emergency Medicine | Admitting: Emergency Medicine

## 2019-02-07 ENCOUNTER — Other Ambulatory Visit: Payer: Self-pay

## 2019-02-07 ENCOUNTER — Encounter (HOSPITAL_COMMUNITY): Payer: Self-pay | Admitting: Emergency Medicine

## 2019-02-07 ENCOUNTER — Encounter (HOSPITAL_COMMUNITY): Payer: Self-pay

## 2019-02-07 ENCOUNTER — Emergency Department (HOSPITAL_COMMUNITY)
Admission: EM | Admit: 2019-02-07 | Discharge: 2019-02-07 | Discharge status: Against Medical Advice | Payer: Medicaid Other | Attending: Emergency Medicine | Admitting: Emergency Medicine

## 2019-02-07 DIAGNOSIS — I10 Essential (primary) hypertension: Secondary | ICD-10-CM | POA: Diagnosis present

## 2019-02-07 DIAGNOSIS — Z202 Contact with and (suspected) exposure to infections with a predominantly sexual mode of transmission: Secondary | ICD-10-CM | POA: Diagnosis not present

## 2019-02-07 HISTORY — DX: Essential (primary) hypertension: I10

## 2019-02-07 NOTE — ED Provider Notes (Signed)
[place]    CSN: [id number] Arrival date & time: 02/07/19  1157      History   Chief Complaint Chief Complaint  Patient presents with  . STD Testing    HPI Jill Stevenson is a 38 y.o. female.   Patient presents with request for STD testing.  She is asymptomatic but her partner had "urethritis" and she is concerned that this is an STD.  She denies vaginal discharge, pelvic pain, abdominal pain, dysuria, back pain, or other symptoms.  The history is provided by the patient.    Past Medical History:  Diagnosis Date  . Hypertension   . Medical history non-contributory     There are no active problems to display for this patient.   Past Surgical History:  Procedure Laterality Date  . CESAREAN SECTION      OB History    Gravida  3   Para  2   Term  2   Preterm      AB      Living  2     SAB      TAB      Ectopic      Multiple      Live Births               Home Medications    Prior to Admission medications   Medication Sig Start Date End Date Taking? Authorizing Provider  baclofen (LIORESAL) 10 MG tablet Take 0.5-1 tablets (5-10 mg total) by mouth 3 (three) times daily as needed for muscle spasms. 02/04/19   Hilts, Legrand Como, MD  etonogestrel-ethinyl estradiol (NUVARING) 0.12-0.015 MG/24HR vaginal ring Insert vaginally and leave in place for 3 consecutive weeks, then remove for 1 week. 01/02/19   Woodroe Mode, MD  gabapentin (NEURONTIN) 100 MG capsule 1 PO q HS, may increase to 1 PO TID if needed. 02/04/19   Hilts, Legrand Como, MD  hydrochlorothiazide (HYDRODIURIL) 25 MG tablet Take 1 tablet (25 mg total) by mouth daily. 01/20/19   Kerin Perna, NP  phentermine (ADIPEX-P) 37.5 MG tablet TK 1 T PO QD IN THE MORNING 01/08/19   [provider]  phentermine 37.5 MG capsule Take 37.5 mg by mouth every morning.    [provider]  SUBOXONE 8-2 MG FILM PLACE 1 FILM UNDER THE TONGUE BID. DO NOT EAT OR DRINK FOR 20 MIN AFTER  12/10/18   [provider]    Family History Family History  Problem Relation Age of Onset  . Breast cancer Maternal Aunt     Social History Social History   Tobacco Use  . Smoking status: Current Some Day Smoker    Packs/day: 0.25    Types: Cigarettes  . Smokeless tobacco: Never Used  Substance Use Topics  . Alcohol use: Yes    Comment: occasionally  . Drug use: No     Allergies   Patient has no known allergies.   Review of Systems Review of Systems  Constitutional: Negative for chills and fever.  HENT: Negative for ear pain and sore throat.   Eyes: Negative for pain and visual disturbance.  Respiratory: Negative for cough and shortness of breath.   Cardiovascular: Negative for chest pain and palpitations.  Gastrointestinal: Negative for abdominal pain and vomiting.  Genitourinary: Negative for dysuria, flank pain, hematuria, pelvic pain and vaginal discharge.  Musculoskeletal: Negative for arthralgias and back pain.  Skin: Negative for color change and rash.  Neurological: Negative for seizures and syncope.  All  other systems reviewed and are negative.    Physical Exam Triage Vital Signs ED Triage Vitals  Enc Vitals Group     BP      Pulse      Resp      Temp      Temp src      SpO2      Weight      Height      Head Circumference      Peak Flow      Pain Score      Pain Loc      Pain Edu?      Excl. in [place]?    No data found.  Updated Vital Signs BP (!) 143/97 (BP Location: Right Arm)   Pulse (!) 110   Temp 98.4 F (36.9 C)   Resp 18   LMP 02/03/2019 (Approximate)   SpO2 94%   Visual Acuity Right Eye Distance:   Left Eye Distance:   Bilateral Distance:    Right Eye Near:   Left Eye Near:    Bilateral Near:     Physical Exam Vitals signs and nursing note reviewed.  Constitutional:      General: She is not in acute distress.    Appearance: She is well-developed.  HENT:     Head: Normocephalic and atraumatic.      Mouth/Throat:     Mouth: Mucous membranes are moist.     Pharynx: Oropharynx is clear.  Eyes:     Conjunctiva/sclera: Conjunctivae normal.  Neck:     Musculoskeletal: Neck supple.  Cardiovascular:     Rate and Rhythm: Normal rate and regular rhythm.     Heart sounds: No murmur.  Pulmonary:     Effort: Pulmonary effort is normal. No respiratory distress.     Breath sounds: Normal breath sounds.  Abdominal:     General: Bowel sounds are normal.     Palpations: Abdomen is soft.     Tenderness: There is no abdominal tenderness. There is no right CVA tenderness, left CVA tenderness, guarding or rebound.  Skin:    General: Skin is warm and dry.     Findings: No rash.  Neurological:     Mental Status: She is alert.      UC Treatments / Results  Labs (all labs ordered are listed, but only abnormal results are displayed) Labs Reviewed  CERVICOVAGINAL ANCILLARY ONLY    EKG   Radiology No results found.  Procedures Procedures (including critical care time)  Medications Ordered in UC Medications - No data to display  Initial Impression / Assessment and Plan / UC Course  I have reviewed the triage vital signs and the nursing notes.  Pertinent labs & imaging results that were available during my care of the patient were reviewed by me and considered in my medical decision making (see chart for details).    Potential exposure to STD.  Elevated blood pressure with known hypertension.  Discussed with patient that her STD tests are pending and that she should not have sex until the test results are back.  She declines HIV and RPR today.  Discussed that she and her partner may need treatment when the test results are back if they are positive.  Also discussed that her blood pressure is elevated today and that she should have this rechecked by her PCP in 2 weeks.  Patient agrees to plan of care.     Final Clinical Impressions(s) / UC Diagnoses   Final  diagnoses:  Potential  exposure to STD  Elevated blood pressure reading in office with diagnosis of hypertension     Discharge Instructions     Your STD tests are pending.  Do not have sex for until your test results are back.  If your test results are positive, we will call you.  You may need treatment and your partner may also need treatment.      Your blood pressure is elevated today at 143/97.  Please have this rechecked by your primary care provider in 2 weeks.        ED Prescriptions    None     Controlled Substance Prescriptions [place] Controlled Substance Registry consulted? Not Applicable   Sharion Balloon, NP 02/07/19 1306

## 2019-02-07 NOTE — ED Notes (Signed)
Pt did not respond when called for vitals check 

## 2019-02-07 NOTE — Discharge Instructions (Addendum)
Your STD tests are pending.  Do not have sex until your test results are back.  If your test results are positive, we will call you.  You may need treatment and your partner may also need treatment.      Your blood pressure is elevated today at 143/97.  Please have this rechecked by your primary care provider in 2 weeks.

## 2019-02-07 NOTE — ED Triage Notes (Signed)
Pt presents for STD testing with no complaints of any symptoms.

## 2019-02-07 NOTE — ED Notes (Signed)
Patient discharged.

## 2019-02-07 NOTE — ED Triage Notes (Signed)
Patient requesting STD screening , reports sexual partner diagnosed with " urethritis " , denies any symptoms /no vaginal discomfort or discharge.

## 2019-02-07 NOTE — ED Notes (Signed)
Pt called no answer 

## 2019-02-11 LAB — CERVICOVAGINAL ANCILLARY ONLY
Bacterial vaginitis: POSITIVE — AB
Candida vaginitis: NEGATIVE
Chlamydia: NEGATIVE
Neisseria Gonorrhea: NEGATIVE
Trichomonas: NEGATIVE

## 2019-02-12 ENCOUNTER — Telehealth (HOSPITAL_COMMUNITY): Payer: Self-pay | Admitting: Emergency Medicine

## 2019-02-12 MED ORDER — METRONIDAZOLE 500 MG PO TABS: 500.0000 mg | ORAL_TABLET | Freq: Two times a day (BID) | ORAL | 0 refills | Status: AC

## 2019-02-12 NOTE — Telephone Encounter (Signed)
Bacterial vaginosis is positive. This was not treated at the urgent care visit.  Flagyl 500 mg BID x 7 days #14 no refills sent to patients pharmacy of choice.    Patient contacted and made aware of    results, all questions answered   

## 2019-02-13 ENCOUNTER — Other Ambulatory Visit: Payer: Self-pay

## 2019-02-13 ENCOUNTER — Ambulatory Visit: Payer: Medicaid Other | Admitting: Physical Therapy

## 2019-02-13 ENCOUNTER — Encounter: Payer: Self-pay | Admitting: Physical Therapy

## 2019-02-13 DIAGNOSIS — M5417 Radiculopathy, lumbosacral region: Secondary | ICD-10-CM

## 2019-02-13 DIAGNOSIS — M5442 Lumbago with sciatica, left side: Secondary | ICD-10-CM | POA: Diagnosis not present

## 2019-02-13 DIAGNOSIS — M62830 Muscle spasm of back: Secondary | ICD-10-CM

## 2019-02-13 DIAGNOSIS — M5441 Lumbago with sciatica, right side: Secondary | ICD-10-CM

## 2019-02-13 NOTE — Patient Instructions (Signed)
Sidelying Quadratus Lumborum Stretch on Table reps: 3 sets: 1 hold: 30 daily: 2  weekly: 7      Exercise image step 1   Exercise image step 2  Sit at the END of the bed on your L hip. Put pillows under your L waist. Lie across the pillows and reach Rt arm overhead, let your Rt leg stretch towards the ground.   Setup  Begin lying on your side on a table or bed with your knees bent at the edge of the table. Movement  Slowly lower your feet off of the table until you feel a stretch in the side of your lower back and hold. Tip  Make sure to continue breathing evenly and keep your body relaxed during the stretch.

## 2019-02-13 NOTE — Therapy (Addendum)
[place], [place], [address] Phone: [phone]   Fax:  [phone]  Physical Therapy Treatment/Discharge  Patient Details  Name: HOMER PFEIFER MRN: [id number] Date of Birth: 01/11/1981 Referring Provider (PT): Dr Eunice Blase    Encounter Date: 02/13/2019  PT End of Session - 02/13/19 1700    Visit Number  2    Number of Visits  4    Date for PT Re-Evaluation  03/06/19    Authorization Type  MCD    Authorization Time Period  9/12- 10/2    Authorization - Visit Number  1    Authorization - Number of Visits  3    PT Start Time  1700    PT Stop Time  1745    PT Time Calculation (min)  45 min    Activity Tolerance  Patient tolerated treatment well    Behavior During Therapy  [facility] for tasks assessed/performed       Past Medical History:  Diagnosis Date  . Hypertension   . Medical history non-contributory     Past Surgical History:  Procedure Laterality Date  . CESAREAN SECTION      There were no vitals filed for this visit.  Subjective Assessment - 02/13/19 1702    Subjective  Pt took meds about 30 min ago.  Pain is 6/10.  Reports Rt LE weakness, but this is not necessarily that new.    Pain Score  6     Pain Location  Back    Pain Orientation  Right    Pain Descriptors / Indicators  Aching    Pain Type  Chronic pain    Pain Radiating Towards  Rt LE    Pain Onset  More than a month ago    Pain Frequency  Constant    Aggravating Factors   standing and walking    Pain Relieving Factors  meds       [facility] Adult PT Treatment/Exercise - 02/13/19 0001      Self-Care   Self-Care  Other Self-Care Comments    Other Self-Care Comments   90/90 decompression       Lumbar Exercises: Stretches   Active Hamstring Stretch  2 reps;30 seconds    Passive Hamstring Stretch Limitations  done in sitting and in supine with strap     Lower Trunk Rotation  10 seconds    Lower Trunk Rotation Limitations   x 10       Lumbar Exercises: Aerobic   Nustep  6 min L 5 Ue and LE       Lumbar Exercises: Seated   Other Seated Lumbar Exercises  ball press out (lg green) to stretch low back      Lumbar Exercises: Supine   Heel Slides Limitations  used large ball x 10       Lumbar Exercises: Sidelying   Other Sidelying Lumbar Exercises  sidelying stretching       Manual Therapy   Manual therapy comments  Long axis distraction Rt LE x 5, 15 sec relieved pain         PT Education - 02/13/19 1738    Education Details  positioning, ball , 90/90 and sidelying self traction    Person(s) Educated  Patient    Methods  Explanation;Demonstration;Verbal cues;Handout    Comprehension  Verbalized understanding;Returned demonstration       PT Short Term Goals - 02/13/19 1739      PT SHORT TERM GOAL #  1   Title  Patient will report pain centralized in her lower back without radating pain into the right leg    Status  On-going      PT SHORT TERM GOAL #2   Title  Patient will rotate to the right without increased painn    Status  On-going      PT SHORT TERM GOAL #3   Title  Patient will report no pain with passive internal and external rotation    Status  On-going               Plan - 02/13/19 1740    Clinical Impression Statement  Patient shown how to self traction her Rt leg to relieve pain.  Decreased tolerance for activity but offered ways to reduce pain. Ice improved her pain post.    PT Frequency  1x / week    PT Duration  3 weeks    PT Treatment/Interventions  ADLs/Self Care Home Management;Cryotherapy;Electrical Stimulation;Iontophoresis 68m/ml Dexamethasone;Moist Heat;Ultrasound;DME Instruction;Gait training;Stair training;Functional mobility training;Therapeutic activities;Therapeutic exercise;Patient/family education;Manual techniques;Passive range of motion;Dry needling;Splinting;Taping    PT Next Visit Plan  consdier sacral METS. Unable to tell if there is an inflare or outflare  because of large habitus, soft tissue mobilization to SI and lumbar aprea; LAD; begin core strenghtening. Sits in APT    PT Home Exercise Plan  lower trunk rotation, seated hamstring stretch; trigger point release to gluteals, sidelying Rt stretch       Patient will benefit from skilled therapeutic intervention in order to improve the following deficits and impairments:  Abnormal gait, Difficulty walking, Decreased range of motion, Decreased endurance, Increased fascial restricitons, Impaired perceived functional ability, Decreased mobility, Decreased strength, Improper body mechanics, Obesity, Impaired flexibility, Decreased activity tolerance, Increased muscle spasms, Postural dysfunction, Pain  Visit Diagnosis: Bilateral low back pain with bilateral sciatica, unspecified chronicity  Radiculopathy, lumbosacral region  Muscle spasm of back     Problem List There are no active problems to display for this patient.   Shontae Rosiles 02/13/2019, 5:51 PM  [facility] [address]Phone: [phone]  Fax:  [phone] Name: YRELENA IVANCICMRN: [id number] of Birth: [birth year] JRaeford Razor PT 02/13/19 5:51 PM Phone: [phone]: [phone]  PHYSICAL THERAPY DISCHARGE SUMMARY  Visits from Start of Care: 2  Current functional level related to goals / functional outcomes: Unknown    Remaining deficits: Unknown   Education / Equipment: HEP  Plan: Patient agrees to discharge.  Patient goals were not met. Patient is being discharged due to not returning since the last visit.  ?????    JRaeford Razor PT 03/17/19 2:29 PM Phone: [phone]: [phone]

## 2019-02-19 ENCOUNTER — Ambulatory Visit: Payer: Medicaid Other | Admitting: Physical Therapy

## 2019-02-20 ENCOUNTER — Ambulatory Visit (INDEPENDENT_AMBULATORY_CARE_PROVIDER_SITE_OTHER): Payer: Self-pay | Admitting: Primary Care

## 2019-02-20 ENCOUNTER — Encounter (INDEPENDENT_AMBULATORY_CARE_PROVIDER_SITE_OTHER): Payer: Self-pay

## 2019-02-20 ENCOUNTER — Telehealth: Payer: Self-pay | Admitting: Physical Therapy

## 2019-02-20 NOTE — Telephone Encounter (Signed)
Called patient regarding no-show visit. Voice mail left stating when the next visit is and to please call if she is not going to make it.

## 2019-02-25 ENCOUNTER — Ambulatory Visit: Payer: Medicaid Other | Admitting: Physical Therapy

## 2019-03-24 ENCOUNTER — Encounter (INDEPENDENT_AMBULATORY_CARE_PROVIDER_SITE_OTHER): Payer: Self-pay

## 2019-03-24 ENCOUNTER — Ambulatory Visit (INDEPENDENT_AMBULATORY_CARE_PROVIDER_SITE_OTHER): Payer: Self-pay | Admitting: Primary Care

## 2019-03-27 ENCOUNTER — Other Ambulatory Visit (HOSPITAL_COMMUNITY)
Admission: RE | Admit: 2019-03-27 | Discharge: 2019-03-27 | Disposition: A | Discharge status: Home / Self Care | Payer: Medicaid Other | Source: Ambulatory Visit | Attending: Primary Care | Admitting: Primary Care

## 2019-03-27 ENCOUNTER — Encounter (INDEPENDENT_AMBULATORY_CARE_PROVIDER_SITE_OTHER): Payer: Self-pay | Admitting: Primary Care

## 2019-03-27 ENCOUNTER — Ambulatory Visit (INDEPENDENT_AMBULATORY_CARE_PROVIDER_SITE_OTHER): Payer: Medicaid Other | Admitting: Primary Care

## 2019-03-27 ENCOUNTER — Other Ambulatory Visit: Payer: Self-pay

## 2019-03-27 VITALS — BP 125/89 | HR 92 | Temp 97.3°F | Ht 72.0 in | Wt 394.2 lb

## 2019-03-27 DIAGNOSIS — Z113 Encounter for screening for infections with a predominantly sexual mode of transmission: Secondary | ICD-10-CM

## 2019-03-27 DIAGNOSIS — F320 Major depressive disorder, single episode, mild: Secondary | ICD-10-CM | POA: Diagnosis not present

## 2019-03-27 DIAGNOSIS — I10 Essential (primary) hypertension: Secondary | ICD-10-CM | POA: Diagnosis not present

## 2019-03-27 DIAGNOSIS — Z6841 Body Mass Index (BMI) 40.0 and over, adult: Secondary | ICD-10-CM

## 2019-03-27 DIAGNOSIS — Z7689 Persons encountering health services in other specified circumstances: Secondary | ICD-10-CM

## 2019-03-27 DIAGNOSIS — N898 Other specified noninflammatory disorders of vagina: Secondary | ICD-10-CM | POA: Insufficient documentation

## 2019-03-27 DIAGNOSIS — Z23 Encounter for immunization: Secondary | ICD-10-CM

## 2019-03-27 MED ORDER — FLUCONAZOLE 150 MG PO TABS: 150.0000 mg | ORAL_TABLET | Freq: Once | ORAL | 0 refills | Status: AC

## 2019-03-27 MED ORDER — HYDROCHLOROTHIAZIDE 25 MG PO TABS: 25.0000 mg | ORAL_TABLET | Freq: Every day | ORAL | 3 refills | Status: DC
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
## 2019-03-27 NOTE — Patient Instructions (Signed)

## 2019-03-28 LAB — CBC WITH DIFFERENTIAL/PLATELET
Basophils Absolute: 0 10*3/uL (ref 0.0–0.2)
Basos: 1 %
EOS (ABSOLUTE): 0.2 10*3/uL (ref 0.0–0.4)
Eos: 2 %
Hematocrit: 40.8 % (ref 34.0–46.6)
Hemoglobin: 13.5 g/dL (ref 11.1–15.9)
Immature Grans (Abs): 0 10*3/uL (ref 0.0–0.1)
Immature Granulocytes: 0 %
Lymphocytes Absolute: 2.8 10*3/uL (ref 0.7–3.1)
Lymphs: 36 %
MCH: 29 pg (ref 26.6–33.0)
MCHC: 33.1 g/dL (ref 31.5–35.7)
MCV: 88 fL (ref 79–97)
Monocytes Absolute: 0.7 10*3/uL (ref 0.1–0.9)
Monocytes: 9 %
Neutrophils Absolute: 4.1 10*3/uL (ref 1.4–7.0)
Neutrophils: 52 %
Platelets: 205 10*3/uL (ref 150–450)
RBC: 4.65 x10E6/uL (ref 3.77–5.28)
RDW: 13.7 % (ref 11.7–15.4)
WBC: 7.7 10*3/uL (ref 3.4–10.8)

## 2019-03-28 LAB — LIPID PANEL
Chol/HDL Ratio: 2.9 ratio (ref 0.0–4.4)
Cholesterol, Total: 184 mg/dL (ref 100–199)
HDL: 64 mg/dL (ref >39)
LDL Chol Calc (NIH): 107 mg/dL — ABNORMAL HIGH (ref 0–99)
Triglycerides: 67 mg/dL (ref 0–149)
VLDL Cholesterol Cal: 13 mg/dL (ref 5–40)

## 2019-03-28 LAB — CMP14+EGFR
ALT: 24 IU/L (ref 0–32)
AST: 14 IU/L (ref 0–40)
Albumin/Globulin Ratio: 1.5 (ref 1.2–2.2)
Albumin: 4.3 g/dL (ref 3.8–4.8)
Alkaline Phosphatase: 91 IU/L (ref 39–117)
BUN/Creatinine Ratio: 13 (ref 9–23)
BUN: 12 mg/dL (ref 6–20)
Bilirubin Total: 0.2 mg/dL (ref 0.0–1.2)
CO2: 22 mmol/L (ref 20–29)
Calcium: 8.7 mg/dL (ref 8.7–10.2)
Chloride: 106 mmol/L (ref 96–106)
Creatinine, Ser: 0.93 mg/dL (ref 0.57–1.00)
GFR calc Af Amer: 90 mL/min/{1.73_m2} (ref >59)
GFR calc non Af Amer: 78 mL/min/{1.73_m2} (ref >59)
Globulin, Total: 2.9 g/dL (ref 1.5–4.5)
Glucose: 100 mg/dL — ABNORMAL HIGH (ref 65–99)
Potassium: 3.9 mmol/L (ref 3.5–5.2)
Sodium: 140 mmol/L (ref 134–144)
Total Protein: 7.2 g/dL (ref 6.0–8.5)

## 2019-03-28 LAB — CERVICOVAGINAL ANCILLARY ONLY
Bacterial Vaginitis (gardnerella): NEGATIVE
Candida Glabrata: NEGATIVE
Candida Vaginitis: POSITIVE — AB
Chlamydia: NEGATIVE
Comment: NEGATIVE
Comment: NEGATIVE
Comment: NEGATIVE
Comment: NEGATIVE
Comment: NEGATIVE
Comment: NORMAL
Neisseria Gonorrhea: NEGATIVE
Trichomonas: NEGATIVE

## 2019-03-30 ENCOUNTER — Other Ambulatory Visit (INDEPENDENT_AMBULATORY_CARE_PROVIDER_SITE_OTHER): Payer: Self-pay | Admitting: Primary Care

## 2019-03-30 MED ORDER — FLUCONAZOLE 150 MG PO TABS: 150.0000 mg | ORAL_TABLET | Freq: Once | ORAL | 0 refills | Status: AC

## 2019-04-08 ENCOUNTER — Other Ambulatory Visit (INDEPENDENT_AMBULATORY_CARE_PROVIDER_SITE_OTHER): Payer: Self-pay | Admitting: Primary Care

## 2019-04-08 ENCOUNTER — Other Ambulatory Visit: Payer: Self-pay

## 2019-04-08 ENCOUNTER — Ambulatory Visit (INDEPENDENT_AMBULATORY_CARE_PROVIDER_SITE_OTHER): Payer: Self-pay | Admitting: Licensed Clinical Social Worker

## 2019-04-08 DIAGNOSIS — F439 Reaction to severe stress, unspecified: Secondary | ICD-10-CM

## 2019-04-08 DIAGNOSIS — F321 Major depressive disorder, single episode, moderate: Secondary | ICD-10-CM

## 2019-04-08 MED ORDER — DULOXETINE HCL 20 MG PO CPEP: 20.0000 mg | ORAL_CAPSULE | Freq: Every day | ORAL | 3 refills | Status: DC

## 2019-04-11 NOTE — BH Specialist Note (Signed)
Integrated Behavioral Health Initial Visit  MRN: [id number] Name: Jill Stevenson  Number of [facility] Clinician visits:: 1/6 Session Start time: 10:45 AM  Session End time: 11:15 AM Total time: 30  Type of Service: [place] Interpretor:No. Interpretor Name and Language: NA   SUBJECTIVE: Jill Stevenson is a 38 y.o. female accompanied by self Patient was referred by NP Edwards for depression and anxiety. Patient reports the following symptoms/concerns: Pt reports battling depression and anxiety "on and off for years" States that symptoms have increased triggered by weight gain resulting in back spasms and difficulty walking/standing Pt reports feeling overwhelmed with medical conditions and trying to obtain housing by December 2020 Duration of problem: Ongoing; Severity of problem: severe  OBJECTIVE: Mood: Anxious and Affect: Depressed Risk of harm to self or others: No plan to harm self or others Pt scored positive on phq9; however, denies current SI/HI. Protective factors identified and crisis intervention resources discussed  LIFE CONTEXT: Family and Social: Pt has a 9 yo son and 31 yo daughter. She receives limited support from family School/Work: Pt has two jobs, one full-time and other part-time. She was recently awarded section eight voucher; however, reports difficulty obtaining eligible properties Self-Care: Pt participates in medication management and is open to psychotherapy to strengthen support system Life Changes: Pt reports difficulty managing mental and physical health conditions  GOALS ADDRESSED: Patient will: 1. Reduce symptoms of: anxiety, depression and stress 2. Increase knowledge and/or ability of: coping skills and healthy habits  3. Demonstrate ability to: Increase healthy adjustment to current life circumstances and Increase adequate support systems for patient/family  INTERVENTIONS: Interventions utilized:  Solution-Focused Strategies, Supportive Counseling and Psychoeducation and/or Health Education  Standardized Assessments completed: GAD-7 and PHQ 2&9 with C-SSRS  ASSESSMENT: Patient currently experiencing depression and anxiety "on and off for years" States that symptoms have increased triggered by weight gain resulting in back spasms and difficulty walking/standing Pt reports feeling overwhelmed with medical conditions and trying to obtain housing by December 2020.   Pt scored positive on phq9; however, denies current SI/HI. Protective factors identified and crisis intervention resources discussed.   Patient participates in mediation management through PCP and is open to psychotherapy to strengthen support system. LCSW discussed correlation between one's physical and mental health. Healthy coping skills were discussed, in addition, to resources that may assist with obtaining housing.   PLAN: 1. Follow up with behavioral health clinician on : Pt was encouraged to contact LCSW with any behavioral health or resource needs.  2. Behavioral recommendations: Continue with medication management and implement healthy coping skills discussed.  3. Referral(s): [place] (In Clinic) 4. "From scale of 1-10, how likely are you to follow plan?":   Rebekah [place], LCSW 04/11/2019 1:11 PM

## 2019-04-15 ENCOUNTER — Other Ambulatory Visit: Payer: Self-pay | Admitting: Family Medicine

## 2019-04-15 MED ORDER — BACLOFEN 10 MG PO TABS: 5.0000 mg | ORAL_TABLET | Freq: Three times a day (TID) | ORAL | 1 refills | Status: DC | PRN

## 2019-06-27 ENCOUNTER — Telehealth (INDEPENDENT_AMBULATORY_CARE_PROVIDER_SITE_OTHER): Payer: Self-pay | Admitting: Primary Care

## 2019-06-27 DIAGNOSIS — I10 Essential (primary) hypertension: Secondary | ICD-10-CM

## 2019-06-27 DIAGNOSIS — F321 Major depressive disorder, single episode, moderate: Secondary | ICD-10-CM

## 2019-06-27 MED ORDER — HYDROCHLOROTHIAZIDE 25 MG PO TABS: 25.0000 mg | ORAL_TABLET | Freq: Every day | ORAL | 3 refills | Status: DC

## 2019-06-27 MED ORDER — DULOXETINE HCL 20 MG PO CPEP: 20.0000 mg | ORAL_CAPSULE | Freq: Every day | ORAL | 1 refills | Status: DC

## 2019-06-27 NOTE — Progress Notes (Signed)
Virtual Visit via Telephone Note  I connected with Jill Stevenson on 06/27/19 at 10:30 AM EST by telephone and verified that I am speaking with the correct person using two identifiers.   I discussed the limitations, risks, security and privacy concerns of performing an evaluation and management service by telephone and the availability of in person appointments. I also discussed with the patient that there may be a patient responsible charge related to this service. The patient expressed understanding and agreed to proceed.   History of Present Illness: [name] is having a tele visit for blood pressure follow up she was recently started on HCTZ 25mg  daily and her reading are systolic [id number] and diastolic [id number] liable.    Past Medical History:  Diagnosis Date  . Hypertension   . Medical history non-contributory    Current Outpatient Medications on File Prior to Visit  Medication Sig Dispense Refill  . baclofen (LIORESAL) 10 MG tablet Take 0.5-1 tablets (5-10 mg total) by mouth 3 (three) times daily as needed for muscle spasms. 30 each 1  . DULoxetine (CYMBALTA) 20 MG capsule Take 1 capsule (20 mg total) by mouth daily. 30 capsule 3  . etonogestrel-ethinyl estradiol (NUVARING) 0.12-0.015 MG/24HR vaginal ring Insert vaginally and leave in place for 3 consecutive weeks, then remove for 1 week. 1 each 12  . gabapentin (NEURONTIN) 100 MG capsule 1 PO q HS, may increase to 1 PO TID if needed. 90 capsule 3  . hydrochlorothiazide (HYDRODIURIL) 25 MG tablet Take 1 tablet (25 mg total) by mouth daily. 90 tablet 3  . phentermine (ADIPEX-P) 37.5 MG tablet TK 1 T PO QD IN THE MORNING     No current facility-administered medications on file prior to visit.   Observations/Objective: Review of Systems  All other systems reviewed and are negative.   Assessment and Plan: Diagnoses and all orders for this visit:  Essential hypertension Blood pressure reading are at goal monitorin at home  130/80, low-sodium, DASH diet, medication compliance, 150 minutes of moderate intensity exercise per week. Discussed medication compliance, adverse effects. Continue HCTZ 25mg  daily   Depression, major, single episode, moderate (Jill Stevenson) Depression is being manage by Cymbalta however with the isolation and pandemic it continues to make patient sadden. She denies thoughts of harm to self or others. Refilled medication and reinforce do not stop abrutley  Other orders -     DULoxetine (CYMBALTA) 20 MG capsule; Take 1 capsule (20 mg total) by mouth daily. -     hydrochlorothiazide (HYDRODIURIL) 25 MG tablet; Take 1 tablet (25 mg total) by mouth daily.    Follow Up Instructions:    I discussed the assessment and treatment plan with the patient. The patient was provided an opportunity to ask questions and all were answered. The patient agreed with the plan and demonstrated an understanding of the instructions.   The patient was advised to call back or seek an in-person evaluation if the symptoms worsen or if the condition fails to improve as anticipated.  I provided 12 minutes of non-face-to-face time during this encounter.   Kerin Perna, NP

## 2020-01-29 ENCOUNTER — Other Ambulatory Visit (INDEPENDENT_AMBULATORY_CARE_PROVIDER_SITE_OTHER): Payer: Self-pay | Admitting: Primary Care

## 2020-01-29 NOTE — Telephone Encounter (Signed)
Requested Prescriptions  Pending Prescriptions Disp Refills  . DULoxetine (CYMBALTA) 20 MG capsule [Pharmacy Med Name: DULOXETINE DR 20MG  CAPSULES] 90 capsule 0    Sig: TAKE 1 CAPSULE(20 MG) BY MOUTH DAILY     Psychiatry: Antidepressants - SNRI Failed - 01/29/2020 11:02 AM      Failed - Valid encounter within last 6 months    Recent Outpatient Visits          7 months ago Essential hypertension   [place], [place], NP   10 months ago Establishing care with new doctor, encounter for   [place] Kerin Perna, NP   1 year ago Acute bilateral low back pain, unspecified whether sciatica present   [place], [place], NP      Future Appointments            In 1 week Kerin Perna, NP [place] BP in normal range    BP Readings from Last 1 Encounters:  03/27/19 125/89          Pt. Was contacted to schedule a f/u appt., due to need for medication refill.  Appt. Sched. 02/09/20 @ 1:30 PM with PCP.  Medication refill given per protocol.

## 2020-02-09 ENCOUNTER — Telehealth (INDEPENDENT_AMBULATORY_CARE_PROVIDER_SITE_OTHER): Payer: Self-pay | Admitting: Primary Care

## 2020-02-11 ENCOUNTER — Other Ambulatory Visit: Payer: Self-pay

## 2020-02-11 ENCOUNTER — Telehealth (INDEPENDENT_AMBULATORY_CARE_PROVIDER_SITE_OTHER): Payer: Self-pay | Admitting: Primary Care

## 2020-02-11 ENCOUNTER — Encounter (INDEPENDENT_AMBULATORY_CARE_PROVIDER_SITE_OTHER): Payer: Self-pay | Admitting: Primary Care

## 2020-02-11 DIAGNOSIS — M545 Low back pain: Secondary | ICD-10-CM

## 2020-02-11 DIAGNOSIS — Z833 Family history of diabetes mellitus: Secondary | ICD-10-CM

## 2020-02-11 DIAGNOSIS — Z13220 Encounter for screening for lipoid disorders: Secondary | ICD-10-CM

## 2020-02-11 DIAGNOSIS — I10 Essential (primary) hypertension: Secondary | ICD-10-CM

## 2020-02-11 DIAGNOSIS — G629 Polyneuropathy, unspecified: Secondary | ICD-10-CM

## 2020-02-11 DIAGNOSIS — N921 Excessive and frequent menstruation with irregular cycle: Secondary | ICD-10-CM

## 2020-02-11 DIAGNOSIS — G8929 Other chronic pain: Secondary | ICD-10-CM

## 2020-02-11 DIAGNOSIS — F321 Major depressive disorder, single episode, moderate: Secondary | ICD-10-CM

## 2020-02-11 MED ORDER — GABAPENTIN 300 MG PO CAPS: 300.0000 mg | ORAL_CAPSULE | Freq: Two times a day (BID) | ORAL | 1 refills | Status: DC

## 2020-02-11 MED ORDER — HYDROCHLOROTHIAZIDE 25 MG PO TABS: 25.0000 mg | ORAL_TABLET | Freq: Every day | ORAL | 1 refills | Status: DC

## 2020-02-11 NOTE — Progress Notes (Signed)
Vitural Note  I connected with Jill Stevenson on 02/11/20 at  9:10 AM EDT by telephone and verified that I am speaking with the correct person using two identifiers.  Patient is at work. I discussed the limitations, risks, security and privacy concerns of performing an evaluation and management service by telephone and the availability of in person appointments. I also discussed with the patient that there may be a patient responsible charge related to this service. The patient expressed understanding and agreed to proceed. Kerin Perna, NP in the office at [facility]  History of Present Illness: Jill Stevenson is a 39 year old female is having a blood pressure follow up. Requesting medications refills. Bp 137/56 last night liable- 947- 654 systolic ( the 650 was isolated and admits to feeling agitated that day and did not take Bp medication. ) diastolic 35-46 . Denies shortness of breath, headaches, chest pain or lower extremity edema Monitoring intake no added salt she uses Ms. Dash and herbs. Exercising 15 minutes daily walking. She does complain of back pain location lower near sacrum area. She has been lifting at home rearranging furniture. Also, experiencing numbness and tingling bilateral hands with loss of grip at times. She does have a family history of diabetes.    Past Medical History:  Diagnosis Date  . Hypertension   . Medical history non-contributory    Current Outpatient Medications on File Prior to Visit  Medication Sig Dispense Refill  . etonogestrel-ethinyl estradiol (NUVARING) 0.12-0.015 MG/24HR vaginal ring Insert vaginally and leave in place for 3 consecutive weeks, then remove for 1 week. 1 each 12  . gabapentin (NEURONTIN) 100 MG capsule 1 PO q HS, may increase to 1 PO TID if needed. 90 capsule 3  . hydrochlorothiazide (HYDRODIURIL) 25 MG tablet TAKE 1 TABLET BY MOUTH EVERY DAY 90 tablet 0  . sertraline (ZOLOFT) 50 MG tablet Take 50 mg by mouth daily.    . SUBOXONE 8-2  MG FILM Place under the tongue 2 (two) times daily.    . baclofen (LIORESAL) 10 MG tablet Take 0.5-1 tablets (5-10 mg total) by mouth 3 (three) times daily as needed for muscle spasms. (Patient not taking: Reported on 02/11/2020) 30 each 1  . DULoxetine (CYMBALTA) 20 MG capsule TAKE 1 CAPSULE(20 MG) BY MOUTH DAILY (Patient not taking: Reported on 02/11/2020) 90 capsule 0  . phentermine (ADIPEX-P) 37.5 MG tablet TK 1 T PO QD IN THE MORNING (Patient not taking: Reported on 02/11/2020)     No current facility-administered medications on file prior to visit.   Observations/Objective: Review of Systems  Musculoskeletal: Positive for back pain.  Neurological: Positive for tingling.       Numbness bilateral hands  All other systems reviewed and are negative.  Assessment and Plan: Jill Stevenson was seen today for medication refill.  Diagnoses and all orders for this visit:  Menorrhagia with irregular cycle Heavy  prolonged vaginal bleeding with menstrual cycles.  -     CBC with Differential; Future  Essential hypertension Blood pressure goal of less than 130/80, low-sodium, DASH diet, medication compliance, 150 minutes of moderate intensity exercise per week. Continue HCTZ 71m and Check blood pressure daily at the same time. Keep a log of all blood pressure readings. If pressure if greater than 150/100 notify me here at the office. If it's persistently greater 180/90, go to the Emergency Department. Take all medication as prescribed. Avoid smoked meats which are high in sodium content. Avoid soda which contains sodium  and are high in sugar which increases your risk for diabetes. -     hydrochlorothiazide (HYDRODIURIL) 25 MG tablet; Take 1 tablet (25 mg total) by mouth daily. -     CMP14+EGFR; Future  Depression, major, single episode, moderate (HCC) Managed by Step by Step   Chronic bilateral low back pain, unspecified whether sciatica present Paresthesia pricking, tingling, and numbness in any  section of the body.It is an anomalous condition characterized by sensations of itching, burning, tingling, prickling, or numbness. Paresthesia may also be described as skin-crawling or pins-and-needles sensations. -     gabapentin (NEURONTIN) 300 MG capsule; Take 1 capsule (300 mg total) by mouth 2 (two) times daily.  Family history of diabetes mellitus -     Hemoglobin A1c; Future  Lipid screening -     Lipid panel; Future    Follow Up Instructions: Step by step manages    I discussed the assessment and treatment plan with the patient. The patient was provided an opportunity to ask questions and all were answered. The patient agreed with the plan and demonstrated an understanding of the instructions.   The patient was advised to call back or seek an in-person evaluation if the symptoms worsen or if the condition fails to improve as anticipated.  I provided 26 minutes of non-face-to-face time during this encounter.   Kerin Perna, NP

## 2020-05-12 ENCOUNTER — Other Ambulatory Visit (INDEPENDENT_AMBULATORY_CARE_PROVIDER_SITE_OTHER): Payer: Self-pay | Admitting: Primary Care

## 2020-05-12 NOTE — Telephone Encounter (Signed)
Requested medication (s) are due for refill today -unsure  Requested medication (s) are on the active medication list -yes  Future visit scheduled -no  Last refill: 01/29/20  Notes to clinic: Patient reported not taking at last visit- sent for review of request- listed as duplicate medication with Zoloft also on list  Requested Prescriptions  Pending Prescriptions Disp Refills   DULoxetine (CYMBALTA) 20 MG capsule [Pharmacy Med Name: DULOXETINE DR 20MG  CAPSULES] 90 capsule 0    Sig: TAKE 1 CAPSULE(20 MG) BY MOUTH DAILY      Psychiatry: Antidepressants - SNRI Passed - 05/12/2020 12:25 PM      Passed - Last BP in normal range    BP Readings from Last 1 Encounters:  03/27/19 125/89          Passed - Valid encounter within last 6 months    Recent Outpatient Visits           3 months ago Menorrhagia with irregular cycle   [facility] Kerin Perna, NP   10 months ago Essential hypertension   [place], [place], NP   1 year ago Establishing care with new doctor, encounter for   [place] Kerin Perna, NP   1 year ago Acute bilateral low back pain, unspecified whether sciatica present   [place], Michelle P, NP                    Requested Prescriptions  Pending Prescriptions Disp Refills   DULoxetine (CYMBALTA) 20 MG capsule [Pharmacy Med Name: DULOXETINE DR 20MG  CAPSULES] 90 capsule 0    Sig: TAKE 1 CAPSULE(20 MG) BY MOUTH DAILY      Psychiatry: Antidepressants - SNRI Passed - 05/12/2020 12:25 PM      Passed - Last BP in normal range    BP Readings from Last 1 Encounters:  03/27/19 125/89          Passed - Valid encounter within last 6 months    Recent Outpatient Visits           3 months ago Menorrhagia with irregular cycle   [facility] Kerin Perna, NP   10 months ago Essential hypertension   [place], [place], NP   1 year ago Establishing care with new doctor, encounter for   [place] Kerin Perna, NP   1 year ago Acute bilateral low back pain, unspecified whether sciatica present   [place] Kerin Perna, NP

## 2020-09-08 ENCOUNTER — Emergency Department (HOSPITAL_BASED_OUTPATIENT_CLINIC_OR_DEPARTMENT_OTHER)
Admit: 2020-09-08 | Discharge: 2020-09-08 | Disposition: A | Discharge status: Home / Self Care | Payer: Medicaid Other | Attending: Emergency Medicine | Admitting: Emergency Medicine

## 2020-09-08 ENCOUNTER — Encounter (HOSPITAL_COMMUNITY): Payer: Self-pay | Admitting: *Deleted

## 2020-09-08 ENCOUNTER — Emergency Department (HOSPITAL_COMMUNITY)
Admission: EM | Admit: 2020-09-08 | Discharge: 2020-09-08 | Disposition: A | Discharge status: Home / Self Care | Payer: Medicaid Other | Attending: Emergency Medicine | Admitting: Emergency Medicine

## 2020-09-08 ENCOUNTER — Other Ambulatory Visit: Payer: Self-pay

## 2020-09-08 ENCOUNTER — Emergency Department (HOSPITAL_COMMUNITY): Payer: Medicaid Other

## 2020-09-08 ENCOUNTER — Ambulatory Visit (INDEPENDENT_AMBULATORY_CARE_PROVIDER_SITE_OTHER): Payer: Medicaid Other | Admitting: Primary Care

## 2020-09-08 ENCOUNTER — Encounter (INDEPENDENT_AMBULATORY_CARE_PROVIDER_SITE_OTHER): Payer: Self-pay | Admitting: Primary Care

## 2020-09-08 VITALS — BP 142/89 | HR 74 | Temp 97.7°F | Ht 72.0 in | Wt 396.6 lb

## 2020-09-08 DIAGNOSIS — Z79899 Other long term (current) drug therapy: Secondary | ICD-10-CM | POA: Insufficient documentation

## 2020-09-08 DIAGNOSIS — Z131 Encounter for screening for diabetes mellitus: Secondary | ICD-10-CM | POA: Diagnosis not present

## 2020-09-08 DIAGNOSIS — I10 Essential (primary) hypertension: Secondary | ICD-10-CM | POA: Insufficient documentation

## 2020-09-08 DIAGNOSIS — R252 Cramp and spasm: Secondary | ICD-10-CM | POA: Diagnosis not present

## 2020-09-08 DIAGNOSIS — R609 Edema, unspecified: Secondary | ICD-10-CM | POA: Diagnosis not present

## 2020-09-08 DIAGNOSIS — R0602 Shortness of breath: Secondary | ICD-10-CM | POA: Insufficient documentation

## 2020-09-08 DIAGNOSIS — M79606 Pain in leg, unspecified: Secondary | ICD-10-CM | POA: Diagnosis not present

## 2020-09-08 DIAGNOSIS — F17210 Nicotine dependence, cigarettes, uncomplicated: Secondary | ICD-10-CM | POA: Insufficient documentation

## 2020-09-08 DIAGNOSIS — E876 Hypokalemia: Secondary | ICD-10-CM | POA: Diagnosis not present

## 2020-09-08 DIAGNOSIS — M7989 Other specified soft tissue disorders: Secondary | ICD-10-CM | POA: Diagnosis not present

## 2020-09-08 DIAGNOSIS — R600 Localized edema: Secondary | ICD-10-CM

## 2020-09-08 LAB — COMPREHENSIVE METABOLIC PANEL
ALT: 25 U/L (ref 0–44)
AST: 20 U/L (ref 15–41)
Albumin: 3.8 g/dL (ref 3.5–5.0)
Alkaline Phosphatase: 72 U/L (ref 38–126)
Anion gap: 9 (ref 5–15)
BUN: 12 mg/dL (ref 6–20)
CO2: 32 mmol/L (ref 22–32)
Calcium: 8.6 mg/dL — ABNORMAL LOW (ref 8.9–10.3)
Chloride: 98 mmol/L (ref 98–111)
Creatinine, Ser: 0.94 mg/dL (ref 0.44–1.00)
GFR, Estimated: >60 mL/min (ref >60)
Glucose, Bld: 80 mg/dL (ref 70–99)
Potassium: 3 mmol/L — ABNORMAL LOW (ref 3.5–5.1)
Sodium: 139 mmol/L (ref 135–145)
Total Bilirubin: 0.5 mg/dL (ref 0.3–1.2)
Total Protein: 7.8 g/dL (ref 6.5–8.1)

## 2020-09-08 LAB — CBC WITH DIFFERENTIAL/PLATELET
Abs Immature Granulocytes: 0.01 10*3/uL (ref 0.00–0.07)
Basophils Absolute: 0 10*3/uL (ref 0.0–0.1)
Basophils Relative: 0 %
Eosinophils Absolute: 0.2 10*3/uL (ref 0.0–0.5)
Eosinophils Relative: 3 %
HCT: 39.8 % (ref 36.0–46.0)
Hemoglobin: 12.7 g/dL (ref 12.0–15.0)
Immature Granulocytes: 0 %
Lymphocytes Relative: 33 %
Lymphs Abs: 2.7 10*3/uL (ref 0.7–4.0)
MCH: 28.6 pg (ref 26.0–34.0)
MCHC: 31.9 g/dL (ref 30.0–36.0)
MCV: 89.6 fL (ref 80.0–100.0)
Monocytes Absolute: 0.8 10*3/uL (ref 0.1–1.0)
Monocytes Relative: 10 %
Neutro Abs: 4.2 10*3/uL (ref 1.7–7.7)
Neutrophils Relative %: 54 %
Platelets: 224 10*3/uL (ref 150–400)
RBC: 4.44 MIL/uL (ref 3.87–5.11)
RDW: 14.3 % (ref 11.5–15.5)
WBC: 8 10*3/uL (ref 4.0–10.5)
nRBC: 0 % (ref 0.0–0.2)

## 2020-09-08 LAB — I-STAT BETA HCG BLOOD, ED (NOT ORDERABLE): I-stat hCG, quantitative: <5 m[IU]/mL (ref <5)

## 2020-09-08 LAB — TROPONIN I (HIGH SENSITIVITY): Troponin I (High Sensitivity): 5 ng/L (ref <18)

## 2020-09-08 MED ORDER — POTASSIUM CHLORIDE ER 10 MEQ PO TBCR: 10.0000 meq | EXTENDED_RELEASE_TABLET | Freq: Every day | ORAL | 0 refills | Status: DC

## 2020-09-08 MED ORDER — CALCIUM CARBONATE-VITAMIN D 500-200 MG-UNIT PO TABS: 1.0000 | ORAL_TABLET | Freq: Every day | ORAL | 0 refills | Status: DC

## 2020-09-08 MED ORDER — CALCIUM CARBONATE ANTACID 500 MG PO CHEW
1.0000 | CHEWABLE_TABLET | Freq: Once | ORAL | Status: AC
  Administered 2020-09-08: 200 mg via ORAL
  Filled 2020-09-08: qty 1

## 2020-09-08 MED ORDER — POTASSIUM CHLORIDE CRYS ER 20 MEQ PO TBCR
40.0000 meq | EXTENDED_RELEASE_TABLET | Freq: Once | ORAL | Status: AC
  Administered 2020-09-08: 40 meq via ORAL
  Filled 2020-09-08: qty 2

## 2020-09-08 NOTE — Discharge Instructions (Signed)
You have been seen and discharged from the emergency department.  Take potassium and calcium supplements as prescribed.  Follow-up with your primary provider for reevaluation and further care. Take home medications as prescribed. If you have any worsening symptoms or further concerns for health please return to an emergency department for further evaluation.

## 2020-09-08 NOTE — ED Provider Notes (Signed)
[facility] DEPT Provider Note   CSN: [id number] Arrival date & time: 09/08/20  1048     History Chief Complaint  Patient presents with  . Leg Swelling  . Arm Pain    Jill Stevenson is a 40 y.o. female.  HPI   40 year old female with past medical history of hypertension, obesity presents the emergency department with multiple complaints.  Her first complaint is acute on chronic swelling of her lower extremities.  Patient states that she has always had swelling of her feet.  Recently the swelling has extended up to both of her calves, her legs are now red, painful and she is unable to wear compression stockings.  She states the swelling still improves with elevation but she is unable to do so with, she works.  Patient states for the last year she has been having tingling of her bilateral hands intermittently.  She states it feels like her hands randomly fall asleep and she has to shake them out and put them above her head to resolve this.  She also has intermittent muscle cramping and aching in bilateral upper extremities.  She now feels short of breath with any exertion and especially with lying flat.  Denies any chest pain.  No recent fever or illness.  Past Medical History:  Diagnosis Date  . Hypertension   . Medical history non-contributory     There are no problems to display for this patient.   Past Surgical History:  Procedure Laterality Date  . CESAREAN SECTION       OB History    Gravida  3   Para  2   Term  2   Preterm      AB      Living  2     SAB      IAB      Ectopic      Multiple      Live Births              Family History  Problem Relation Age of Onset  . Breast cancer Maternal Aunt     Social History   Tobacco Use  . Smoking status: Current Some Day Smoker    Packs/day: 0.25    Types: Cigarettes  . Smokeless tobacco: Never Used  Substance Use Topics  . Alcohol use: Yes    Comment: occasionally   . Drug use: No    Home Medications Prior to Admission medications   Medication Sig Start Date End Date Taking? Authorizing Provider  DULoxetine (CYMBALTA) 20 MG capsule TAKE 1 CAPSULE(20 MG) BY MOUTH DAILY 05/13/20   Charlott Rakes, MD  etonogestrel-ethinyl estradiol (NUVARING) 0.12-0.015 MG/24HR vaginal ring Insert vaginally and leave in place for 3 consecutive weeks, then remove for 1 week. 01/02/19   Woodroe Mode, MD  gabapentin (NEURONTIN) 300 MG capsule Take 1 capsule (300 mg total) by mouth 2 (two) times daily. 02/11/20   Kerin Perna, NP  hydrochlorothiazide (HYDRODIURIL) 25 MG tablet Take 1 tablet (25 mg total) by mouth daily. 02/11/20   Kerin Perna, NP    Allergies    Patient has no known allergies.  Review of Systems   Review of Systems  Constitutional: Negative for chills and fever.  HENT: Negative for congestion.   Eyes: Negative for visual disturbance.  Respiratory: Positive for shortness of breath. Negative for cough.   Cardiovascular: Positive for leg swelling. Negative for chest pain and palpitations.  Gastrointestinal: Negative for abdominal pain,  diarrhea and vomiting.  Genitourinary: Negative for dysuria.  Musculoskeletal: Positive for myalgias.  Skin: Negative for rash.  Neurological: Negative for headaches.    Physical Exam Updated Vital Signs BP (!) 148/91 (BP Location: Left Arm)   Pulse 81   Temp 98 F (36.7 C) (Oral)   Resp 18   LMP 08/26/2020 (Exact Date)   SpO2 98%   Physical Exam Vitals and nursing note reviewed.  Constitutional:      Appearance: She is obese.  HENT:     Head: Normocephalic.     Mouth/Throat:     Mouth: Mucous membranes are moist.  Cardiovascular:     Rate and Rhythm: Normal rate.  Pulmonary:     Effort: Pulmonary effort is normal. No respiratory distress.  Abdominal:     Palpations: Abdomen is soft.     Tenderness: There is no abdominal tenderness.  Musculoskeletal:     Comments: Large, obese and  edematous bilateral lower extremities, 2-3+ pitting edema of the feet extending up the calf with scattered overlying redness, no open wounds or drainage.  Equal palpable pulses in the bilateral upper extremities, equal grip strength.  Skin:    General: Skin is warm.  Neurological:     Mental Status: She is alert and oriented to person, place, and time. Mental status is at baseline.  Psychiatric:        Mood and Affect: Mood normal.     ED Results / Procedures / Treatments   Labs (all labs ordered are listed, but only abnormal results are displayed) Labs Reviewed  CBC WITH DIFFERENTIAL/PLATELET  COMPREHENSIVE METABOLIC PANEL  I-STAT BETA HCG BLOOD, ED ([facility], [facility], [facility] ONLY)  TROPONIN I (HIGH SENSITIVITY)    EKG None  Radiology No results found.  Procedures Procedures   Medications Ordered in ED Medications - No data to display  ED Course  I have reviewed the triage vital signs and the nursing notes.  Pertinent labs & imaging results that were available during my care of the patient were reviewed by me and considered in my medical decision making (see chart for details).    MDM Rules/Calculators/A&P                          40 yo female presents to the emergency department swelling of her bilateral lower extremities, tingling and achiness in her arms/hands as well as intermittent shortness of breath.  EKG is normal sinus rhythm, no acute ischemic changes.  RSR prime in V1 but not concerning for right bundle branch block.  She is PERC negative.  Dopplers of the lower extremities show no blood clot.  Blood work shows a hypo-Cal C. Mia and hypokalemia, replaced in the department.  This could be a component of her achiness and paresthesias.  I believe the bilateral lower extremity edema is acute on chronic, most likely secondary to her body habitus.  I encouraged the patient for compressive therapy and elevation.  She will follow up with her primary doctor to see if more aggressive  measures need to take place.  Patient will be discharged and treated as an outpatient.  Discharge plan and strict return to ED precautions discussed, patient verbalizes understanding and agreement.  Final Clinical Impression(s) / ED Diagnoses Final diagnoses:  None    Rx / DC Orders ED Discharge Orders    None       Lorelle Gibbs, DO 09/08/20 1548

## 2020-09-08 NOTE — ED Triage Notes (Signed)
Pt complains of bilateral leg pain and swelling for the past month. Also has bilateral numbness and tingling in her arms since last year. Shortness of breath when walking and laying down. No cardiac history.

## 2020-09-08 NOTE — Progress Notes (Signed)
Acute Office Visit  Subjective:    Patient ID: Jill Stevenson, female    DOB: [birth year], 40 y.o.   MRN: [id number]  Chief Complaint  Patient presents with  . Edema    Both feet     HPI  [name] "Jill Stevenson is a 40 year old female who presented for bilateral leg swelling and pain.  Due to the increase the pain she left and stated she was going to the emergency room for evaluation   Past Medical History:  Diagnosis Date  . Hypertension   . Medical history non-contributory     Past Surgical History:  Procedure Laterality Date  . CESAREAN SECTION      Family History  Problem Relation Age of Onset  . Breast cancer Maternal Aunt     Social History   Socioeconomic History  . Marital status: Single    Spouse name: Not on file  . Number of children: Not on file  . Years of education: Not on file  . Highest education level: Not on file  Occupational History  . Not on file  Tobacco Use  . Smoking status: Current Some Day Smoker    Packs/day: 0.25    Types: Cigarettes  . Smokeless tobacco: Never Used  Substance and Sexual Activity  . Alcohol use: Yes    Comment: occasionally  . Drug use: No  . Sexual activity: Not on file  Other Topics Concern  . Not on file  Social History Narrative  . Not on file   Social Determinants of Health   Financial Resource Strain: Not on file  Food Insecurity: Not on file  Transportation Needs: Not on file  Physical Activity: Not on file  Stress: Not on file  Social Connections: Not on file  Intimate Partner Violence: Not on file    Outpatient Medications Prior to Visit  Medication Sig Dispense Refill  . DULoxetine (CYMBALTA) 20 MG capsule TAKE 1 CAPSULE(20 MG) BY MOUTH DAILY 90 capsule 0  . etonogestrel-ethinyl estradiol (NUVARING) 0.12-0.015 MG/24HR vaginal ring Insert vaginally and leave in place for 3 consecutive weeks, then remove for 1 week. 1 each 12  . gabapentin (NEURONTIN) 300 MG capsule Take 1 capsule (300 mg  total) by mouth 2 (two) times daily. 180 capsule 1  . hydrochlorothiazide (HYDRODIURIL) 25 MG tablet Take 1 tablet (25 mg total) by mouth daily. 90 tablet 1  . baclofen (LIORESAL) 10 MG tablet Take 0.5-1 tablets (5-10 mg total) by mouth 3 (three) times daily as needed for muscle spasms. (Patient not taking: Reported on 02/11/2020) 30 each 1  . phentermine (ADIPEX-P) 37.5 MG tablet TK 1 T PO QD IN THE MORNING (Patient not taking: Reported on 02/11/2020)    . sertraline (ZOLOFT) 50 MG tablet Take 50 mg by mouth daily.    . SUBOXONE 8-2 MG FILM Place under the tongue 2 (two) times daily.     No facility-administered medications prior to visit.    No Known Allergies  Review of Systems Swollen legs and feet    Objective:    Physical Exam Unable to do patient left  BP (!) 142/89 (BP Location: Right Wrist, Patient Position: Sitting, Cuff Size: Normal)   Pulse 74   Temp 97.7 F (36.5 C) (Temporal)   Ht 6' (1.829 m)   Wt (!) 396 lb 9.6 oz (179.9 kg)   LMP 08/26/2020 (Exact Date)   SpO2 96%   BMI 53.79 kg/m  Wt Readings from Last 3 Encounters:  09/08/20 Marland Kitchen)  396 lb 9.6 oz (179.9 kg)  03/27/19 (!) 394 lb 3.2 oz (178.8 kg)  01/02/19 (!) 378 lb (171.5 kg)    Health Maintenance Due  Topic Date Due  . COVID-19 Vaccine (1) Never done    There are no preventive care reminders to display for this patient.   No results found for: TSH Lab Results  Component Value Date   WBC 7.7 03/27/2019   HGB 13.5 03/27/2019   HCT 40.8 03/27/2019   MCV 88 03/27/2019   PLT 205 03/27/2019   Lab Results  Component Value Date   NA 140 03/27/2019   K 3.9 03/27/2019   CO2 22 03/27/2019   GLUCOSE 100 (H) 03/27/2019   BUN 12 03/27/2019   CREATININE 0.93 03/27/2019   BILITOT 0.2 03/27/2019   ALKPHOS 91 03/27/2019   AST 14 03/27/2019   ALT 24 03/27/2019   PROT 7.2 03/27/2019   ALBUMIN 4.3 03/27/2019   CALCIUM 8.7 03/27/2019   Lab Results  Component Value Date   CHOL 184 03/27/2019   Lab  Results  Component Value Date   HDL 64 03/27/2019   Lab Results  Component Value Date   LDLCALC 107 (H) 03/27/2019   Lab Results  Component Value Date   TRIG 67 03/27/2019   Lab Results  Component Value Date   CHOLHDL 2.9 03/27/2019   No results found for: HGBA1C     Assessment & Plan:   Problem List Items Addressed This Visit   None   Visit Diagnoses    Screening for diabetes mellitus    -  Primary   Relevant Orders   HgB A1c       No orders of the defined types were placed in this encounter.    Jill Perna, NP

## 2020-09-08 NOTE — Progress Notes (Signed)
Bilateral lower extremity venous duplex has been completed. Preliminary results can be found in CV Proc through chart review.  Results were given to Dr. Dina Rich.  09/08/20 12:48 PM Jill Stevenson RVT

## 2020-12-21 ENCOUNTER — Ambulatory Visit (INDEPENDENT_AMBULATORY_CARE_PROVIDER_SITE_OTHER): Payer: Self-pay | Admitting: *Deleted

## 2020-12-21 NOTE — Telephone Encounter (Signed)
Reason for Disposition  Numbness (i.e., loss of sensation) in hand or fingers  Answer Assessment - Initial Assessment Questions 1. ONSET: "When did the pain start?"     Over 6 months ago but now worse 2. LOCATION: "Where is the pain located?"     Right shoulder front side of shoulder  3. PAIN: "How bad is the pain?" (Scale 1-10; or mild, moderate, severe)   - MILD (1-3): doesn't interfere with normal activities   - MODERATE (4-7): interferes with normal activities (e.g., work or school) or awakens from sleep   - SEVERE (8-10): excruciating pain, unable to do any normal activities, unable to move arm at all due to pain     Very painful. Can function but difficulty to move right arm  4. WORK OR EXERCISE: "Has there been any recent work or exercise that involved this part of the body?"     No  5. CAUSE: "What do you think is causing the shoulder pain?"     Not sure  6. OTHER SYMPTOMS: "Do you have any other symptoms?" (e.g., neck pain, swelling, rash, fever, numbness, weakness)     Neck pain, numbness and tingling in bilateral arms right worse than left  7. PREGNANCY: "Is there any chance you are pregnant?" "When was your last menstrual period?"     na  Protocols used: Shoulder Pain-A-AH

## 2020-12-21 NOTE — Telephone Encounter (Signed)
C/o increasing pain in right shoulder x 6 months and now worse. Pain on and off , aches, can't sleep, has to sit up to sleep. Bilateral hands , arms and finger numb. Right arm worse than left. Is performing ADL but severe pain noted in right arm and neck. Sharp pain reported going up neck. Reports bilateral hands and fingers tingling and numb. Tylenol and ibuprofen not relieving pain any longer. Earliest appt scheduled for 01/04/21. Please notify patient if earlier appt available. Care advise given. Patient verbalized understanding of care advise and to call back or go to [facility] or ED if symptoms worsen.

## 2021-01-04 ENCOUNTER — Encounter (INDEPENDENT_AMBULATORY_CARE_PROVIDER_SITE_OTHER): Payer: Self-pay | Admitting: Nurse Practitioner

## 2021-01-04 ENCOUNTER — Ambulatory Visit (INDEPENDENT_AMBULATORY_CARE_PROVIDER_SITE_OTHER): Payer: Medicaid Other | Admitting: Nurse Practitioner

## 2021-01-04 ENCOUNTER — Other Ambulatory Visit: Payer: Self-pay

## 2021-01-04 VITALS — BP 182/103 | HR 71 | Temp 97.3°F | Ht 72.0 in | Wt 377.8 lb

## 2021-01-04 DIAGNOSIS — G8929 Other chronic pain: Secondary | ICD-10-CM

## 2021-01-04 DIAGNOSIS — R200 Anesthesia of skin: Secondary | ICD-10-CM

## 2021-01-04 DIAGNOSIS — I10 Essential (primary) hypertension: Secondary | ICD-10-CM

## 2021-01-04 DIAGNOSIS — R202 Paresthesia of skin: Secondary | ICD-10-CM

## 2021-01-04 DIAGNOSIS — M25511 Pain in right shoulder: Secondary | ICD-10-CM | POA: Diagnosis not present

## 2021-01-04 MED ORDER — PREDNISONE 20 MG PO TABS: 20.0000 mg | ORAL_TABLET | Freq: Every day | ORAL | 0 refills | Status: AC

## 2021-01-04 MED ORDER — TIZANIDINE HCL 4 MG PO TABS: 4.0000 mg | ORAL_TABLET | Freq: Four times a day (QID) | ORAL | 0 refills | Status: DC | PRN

## 2021-01-04 NOTE — Assessment & Plan Note (Signed)
Numbness and tingling to right hand:  No heavy lifting to right shoulder  Ice packs as needed  Will place referral to ortho  Will check xray  Will order prednisone  Will order tizanidine    Hypertension:  May be elevated due to pain  Please take medications as directed  Keep BP log   Low sodium diet    Follow up:  Follow up in 2 weeks for BP recheck

## 2021-01-04 NOTE — Patient Instructions (Addendum)
Chronic Right shoulder pain Numbness and tingling to right hand:  No heavy lifting to right shoulder  Ice packs as needed  Will place referral to ortho  Will check xray  Will order prednisone  Will order tizanidine    Hypertension:  May be elevated due to pain  Please take medications as directed  Keep BP log   Low sodium diet    Follow up:  Follow up in 2 weeks for BP recheck    Shoulder Pain Many things can cause shoulder pain, including: An injury. Moving the shoulder in the same way again and again (overuse). Joint pain (arthritis). Pain can come from: Swelling and irritation (inflammation) of any part of the shoulder. An injury to the shoulder joint. An injury to: Tissues that connect muscle to bone (tendons). Tissues that connect bones to each other (ligaments). Bones. Follow these instructions at home: Watch for changes in your symptoms. Let your doctor know about them. Followthese instructions to help with your pain. If you have a sling: Wear the sling as told by your doctor. Remove it only as told by your doctor. Loosen the sling if your fingers: Tingle. Become numb. Turn cold and blue. Keep the sling clean. If the sling is not waterproof: Do not let it get wet. Take the sling off when you shower or bathe. Managing pain, stiffness, and swelling  If told, put ice on the painful area: Put ice in a plastic bag. Place a towel between your skin and the bag. Leave the ice on for 20 minutes, 2-3 times a day. Stop putting ice on if it does not help with the pain. Squeeze a soft ball or a foam pad as much as possible. This prevents swelling in the shoulder. It also helps to strengthen the arm.  General instructions Take over-the-counter and prescription medicines only as told by your doctor. Keep all follow-up visits as told by your doctor. This is important. Contact a doctor if: Your pain gets worse. Medicine does not help your pain. You have  new pain in your arm, hand, or fingers. Get help right away if: Your arm, hand, or fingers: Tingle. Are numb. Are swollen. Are painful. Turn white or blue. Summary Shoulder pain can be caused by many things. These include injury, moving the shoulder in the same away again and again, and joint pain. Watch for changes in your symptoms. Let your doctor know about them. This condition may be treated with a sling, ice, and pain medicine. Contact your doctor if the pain gets worse or you have new pain. Get help right away if your arm, hand, or fingers tingle or get numb, swollen, or painful. Keep all follow-up visits as told by your doctor. This is important. This information is not intended to replace advice given to you by your health care provider. Make sure you discuss any questions you have with your healthcare provider. Document Revised: 11/27/2017 Document Reviewed: 11/27/2017 Elsevier Patient Education  [place].

## 2021-01-04 NOTE — Progress Notes (Signed)
$'@Patient'k$  ID: Jill Stevenson, female    DOB: 11-Mar-1981, 40 y.o.   MRN: [id number]  Chief Complaint  Patient presents with   Edema    Bilateral legs/feet    Referring provider: Kerin Perna, NP  HPI  Patient presents today for acute visit.  Patient states that she has been having right shoulder pain.  She admits that this pain has been chronic for 1 year.  She has not been evaluated for this before.  She states that she does have decreased range of motion and cannot lift her arm above shoulder level.  She does have associated right finger numbness and tingling. Patient's job does require heavy lifting. Denies f/c/s, n/v/d, hemoptysis, PND, chest pain or edema.  Note: Patient's BP is elevated. She is noncompliant with medications. She is also in pain. Advised patient to take BP meds as directed and go to the ED if symptomatic.       No Known Allergies  Immunization History  Administered Date(s) Administered   Tdap 06/19/2012, 06/24/2014    Past Medical History:  Diagnosis Date   Hypertension    Medical history non-contributory     Tobacco History: Social History   Tobacco Use  Smoking Status Some Days   Packs/day: 0.25   Types: Cigarettes  Smokeless Tobacco Never   Ready to quit: No Counseling given: Yes   Outpatient Encounter Medications as of 01/04/2021  Medication Sig   acetaminophen (TYLENOL) 325 MG tablet Take 650 mg by mouth every 6 (six) hours as needed for mild pain, fever or headache.   etonogestrel-ethinyl estradiol (NUVARING) 0.12-0.015 MG/24HR vaginal ring Insert vaginally and leave in place for 3 consecutive weeks, then remove for 1 week.   hydrochlorothiazide (HYDRODIURIL) 25 MG tablet Take 1 tablet (25 mg total) by mouth daily.   predniSONE (DELTASONE) 20 MG tablet Take 1 tablet (20 mg total) by mouth daily with breakfast for 5 days.   tiZANidine (ZANAFLEX) 4 MG tablet Take 1 tablet (4 mg total) by mouth every 6 (six) hours as needed for muscle  spasms.   calcium-vitamin D (OSCAL WITH D) 500-200 MG-UNIT tablet Take 1 tablet by mouth daily with breakfast for 7 days.   [DISCONTINUED] DULoxetine (CYMBALTA) 20 MG capsule TAKE 1 CAPSULE(20 MG) BY MOUTH DAILY (Patient taking differently: Take 20 mg by mouth daily.)   [DISCONTINUED] gabapentin (NEURONTIN) 300 MG capsule Take 1 capsule (300 mg total) by mouth 2 (two) times daily.   [DISCONTINUED] potassium chloride (KLOR-CON) 10 MEQ tablet Take 1 tablet (10 mEq total) by mouth daily for 7 days.   No facility-administered encounter medications on file as of 01/04/2021.     Review of Systems  Review of Systems  Constitutional: Negative.   HENT: Negative.    Respiratory:  Negative for cough and shortness of breath.   Cardiovascular: Negative.   Gastrointestinal: Negative.   Musculoskeletal:        Right shoulder pain with decreased ROM  Allergic/Immunologic: Negative.   Neurological: Negative.   Psychiatric/Behavioral: Negative.        Physical Exam  BP (!) 182/103 (BP Location: Right Wrist, Patient Position: Sitting, Cuff Size: Normal)   Pulse 71   Temp (!) 97.3 F (36.3 C) (Temporal)   Ht 6' (1.829 m)   Wt (!) 377 lb 12.8 oz (171.4 kg)   SpO2 93%   BMI 51.24 kg/m   Wt Readings from Last 5 Encounters:  01/04/21 (!) 377 lb 12.8 oz (171.4 kg)  09/08/20 (!) 396  lb 9.6 oz (179.9 kg)  03/27/19 (!) 394 lb 3.2 oz (178.8 kg)  01/02/19 (!) 378 lb (171.5 kg)  10/23/17 (!) 350 lb (158.8 kg)     Physical Exam Vitals and nursing note reviewed.  Constitutional:      General: She is not in acute distress.    Appearance: She is well-developed.  Cardiovascular:     Rate and Rhythm: Normal rate and regular rhythm.  Pulmonary:     Effort: Pulmonary effort is normal.     Breath sounds: Normal breath sounds.  Musculoskeletal:     Right shoulder: Tenderness present. Decreased range of motion. Decreased strength.     Right lower leg: No edema.     Left lower leg: No edema.   Neurological:     Mental Status: She is alert and oriented to person, place, and time.     Lab Results:  CBC    Component Value Date/Time   WBC 8.0 09/08/2020 1234   RBC 4.44 09/08/2020 1234   HGB 12.7 09/08/2020 1234   HGB 13.5 03/27/2019 1110   HCT 39.8 09/08/2020 1234   HCT 40.8 03/27/2019 1110   PLT 224 09/08/2020 1234   PLT 205 03/27/2019 1110   MCV 89.6 09/08/2020 1234   MCV 88 03/27/2019 1110   MCH 28.6 09/08/2020 1234   MCHC 31.9 09/08/2020 1234   RDW 14.3 09/08/2020 1234   RDW 13.7 03/27/2019 1110   LYMPHSABS 2.7 09/08/2020 1234   LYMPHSABS 2.8 03/27/2019 1110   MONOABS 0.8 09/08/2020 1234   EOSABS 0.2 09/08/2020 1234   EOSABS 0.2 03/27/2019 1110   BASOSABS 0.0 09/08/2020 1234   BASOSABS 0.0 03/27/2019 1110    BMET    Component Value Date/Time   NA 139 09/08/2020 1234   NA 140 03/27/2019 1110   K 3.0 (L) 09/08/2020 1234   CL 98 09/08/2020 1234   CO2 32 09/08/2020 1234   GLUCOSE 80 09/08/2020 1234   BUN 12 09/08/2020 1234   BUN 12 03/27/2019 1110   CREATININE 0.94 09/08/2020 1234   CALCIUM 8.6 (L) 09/08/2020 1234   GFRNONAA >60 09/08/2020 1234   GFRAA 90 03/27/2019 1110    BNP No results found for: BNP  ProBNP No results found for: PROBNP  Imaging: No results found.   Assessment & Plan:   Chronic right shoulder pain Numbness and tingling to right hand:  No heavy lifting to right shoulder  Ice packs as needed  Will place referral to ortho  Will check xray  Will order prednisone  Will order tizanidine    Hypertension:  May be elevated due to pain  Please take medications as directed  Keep BP log   Low sodium diet    Follow up:  Follow up in 2 weeks for BP recheck   Patient Instructions  Chronic Right shoulder pain Numbness and tingling to right hand:  No heavy lifting to right shoulder  Ice packs as needed  Will place referral to ortho  Will check xray  Will order prednisone  Will order  tizanidine    Hypertension:  May be elevated due to pain  Please take medications as directed  Keep BP log   Low sodium diet    Follow up:  Follow up in 2 weeks for BP recheck    Shoulder Pain Many things can cause shoulder pain, including: An injury. Moving the shoulder in the same way again and again (overuse). Joint pain (arthritis). Pain can come from: Swelling and irritation (  inflammation) of any part of the shoulder. An injury to the shoulder joint. An injury to: Tissues that connect muscle to bone (tendons). Tissues that connect bones to each other (ligaments). Bones. Follow these instructions at home: Watch for changes in your symptoms. Let your doctor know about them. Followthese instructions to help with your pain. If you have a sling: Wear the sling as told by your doctor. Remove it only as told by your doctor. Loosen the sling if your fingers: Tingle. Become numb. Turn cold and blue. Keep the sling clean. If the sling is not waterproof: Do not let it get wet. Take the sling off when you shower or bathe. Managing pain, stiffness, and swelling  If told, put ice on the painful area: Put ice in a plastic bag. Place a towel between your skin and the bag. Leave the ice on for 20 minutes, 2-3 times a day. Stop putting ice on if it does not help with the pain. Squeeze a soft ball or a foam pad as much as possible. This prevents swelling in the shoulder. It also helps to strengthen the arm.  General instructions Take over-the-counter and prescription medicines only as told by your doctor. Keep all follow-up visits as told by your doctor. This is important. Contact a doctor if: Your pain gets worse. Medicine does not help your pain. You have new pain in your arm, hand, or fingers. Get help right away if: Your arm, hand, or fingers: Tingle. Are numb. Are swollen. Are painful. Turn white or blue. Summary Shoulder pain can be caused by many things.  These include injury, moving the shoulder in the same away again and again, and joint pain. Watch for changes in your symptoms. Let your doctor know about them. This condition may be treated with a sling, ice, and pain medicine. Contact your doctor if the pain gets worse or you have new pain. Get help right away if your arm, hand, or fingers tingle or get numb, swollen, or painful. Keep all follow-up visits as told by your doctor. This is important. This information is not intended to replace advice given to you by your health care provider. Make sure you discuss any questions you have with your healthcare provider. Document Revised: 11/27/2017 Document Reviewed: 11/27/2017 Elsevier Patient Education  2022 [place], [place] 01/04/2021

## 2021-01-05 ENCOUNTER — Ambulatory Visit (INDEPENDENT_AMBULATORY_CARE_PROVIDER_SITE_OTHER): Payer: Self-pay | Admitting: Family Medicine

## 2021-01-05 ENCOUNTER — Encounter: Payer: Self-pay | Admitting: Family Medicine

## 2021-01-05 DIAGNOSIS — G8929 Other chronic pain: Secondary | ICD-10-CM

## 2021-01-05 DIAGNOSIS — M542 Cervicalgia: Secondary | ICD-10-CM

## 2021-01-05 DIAGNOSIS — M25511 Pain in right shoulder: Secondary | ICD-10-CM

## 2021-01-05 NOTE — Progress Notes (Signed)
Office Visit Note   Patient: Jill Stevenson           Date of Birth: 01/18/1981           MRN: [id number] Visit Date: 01/05/2021 Requested by: Fenton Foy, NP [place],  [place] [address] PCP: Kerin Perna, NP  Subjective: Chief Complaint  Patient presents with   Right Shoulder - Pain    Chronic pain x 1 year. Worsening. NKI. This started after she had back spasms in the middle/upper right back. Deep pain in the shoulder - aches. Numbness in both hands. Right-hand dominant. Occasional issues with her ROM. Pain goes into the neck at times, with some stiffness. Sleeps with a lot of pillows to keep her upper body upright in bed. Has not picked up prednisone taper & tizanidine yet from the pharmacy.     HPI: She is here with neck and right shoulder pain.  Symptoms started about a year ago, no definite injury.  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  Pain is gotten steadily worse.  She was given a prescription for prednisone and a muscle relaxant yesterday but she has not been able to get them filled yet.  Pain travels into the right arm and causes a numbness and tingling sensation in both of her hands.  She is right-hand dominant.              ROS:   All other systems were reviewed and are negative.  Objective: Vital Signs: There were no vitals taken for this visit.  Physical Exam:  General:  Alert and oriented, in no acute distress. Pulm:  Breathing unlabored. Psy:  Normal mood, congruent affect.  Neck: She has equivocal Spurling's test.  Limited range of motion with rotation and sidebending.  Tender in the paraspinous muscles especially near C6-7.  Upper extremity strength and reflexes are still normal. Right shoulder: Full range of motion with no adhesive capsulitis.  Lots of pain at the extremes.  Pain with empty can test but rotator cuff strength is intact.  No  tenderness at the [facility] joint.   Imaging: No results found.  Assessment & Plan: Chronic neck and right arm pain, possibly due to rotator cuff tendinopathy but cannot rule out cervical radiculopathy -She will go ahead and fill her medications and start taking them.  Physical therapy referral given.  If no improvement, then x-rays and MRI scan of right shoulder and cervical spine.     Procedures: No procedures performed        PMFS History: Patient Active Problem List   Diagnosis Date Noted   Chronic right shoulder pain 01/04/2021   Numbness and tingling in right hand 01/04/2021   Essential hypertension 01/04/2021   Past Medical History:  Diagnosis Date   Hypertension    Medical history non-contributory     Family History  Problem Relation Age of Onset   Breast cancer Maternal Aunt     Past Surgical History:  Procedure Laterality Date   CESAREAN SECTION     Social History   Occupational History   Not on file  Tobacco Use   Smoking status: Some Days    Packs/day: 0.25    Types: Cigarettes   Smokeless tobacco: Never  Substance and Sexual Activity   Alcohol use: Yes    Comment: occasionally   Drug use: No  Sexual activity: Not on file

## 2021-01-18 ENCOUNTER — Ambulatory Visit: Payer: Medicaid Other | Attending: Primary Care

## 2021-01-18 ENCOUNTER — Other Ambulatory Visit: Payer: Self-pay

## 2021-01-18 DIAGNOSIS — M25511 Pain in right shoulder: Secondary | ICD-10-CM | POA: Insufficient documentation

## 2021-01-18 DIAGNOSIS — M6281 Muscle weakness (generalized): Secondary | ICD-10-CM | POA: Diagnosis present

## 2021-01-18 DIAGNOSIS — G8929 Other chronic pain: Secondary | ICD-10-CM | POA: Diagnosis present

## 2021-01-18 DIAGNOSIS — R208 Other disturbances of skin sensation: Secondary | ICD-10-CM

## 2021-01-18 DIAGNOSIS — M542 Cervicalgia: Secondary | ICD-10-CM

## 2021-01-18 NOTE — Patient Instructions (Signed)
  [id number]

## 2021-01-18 NOTE — Therapy (Signed)
[place], [place], [address] Phone: [phone]   Fax:  [phone]  Physical Therapy Evaluation  Patient Details  Name: Jill Stevenson MRN: [id number] Date of Birth: September 23, 1980 Referring Provider (PT): Juluis Mire P   Encounter Date: 01/18/2021   PT End of Session - 01/18/21 1559     Visit Number 1    Number of Visits 9    Date for PT Re-Evaluation 03/15/21    Authorization Type [employer] MCD    Authorization - Visit Number 1    Authorization - Number of Visits 27    Progress Note Due on Visit 10    PT Start Time 1455   Pt arrived 8 minutes late to appointment   PT Stop Time 1540    PT Time Calculation (min) 45 min    Activity Tolerance Patient tolerated treatment well    Behavior During Therapy [facility] for tasks assessed/performed             Past Medical History:  Diagnosis Date   Hypertension    Medical history non-contributory     Past Surgical History:  Procedure Laterality Date   CESAREAN SECTION      There were no vitals filed for this visit.    Subjective Assessment - 01/18/21 1457     Subjective Pt reports to PT with primary c/o chronic R shoulder and cervical pain with N/T in BIL hands (all digits) R>L of insidious onset starting in early 2021. She reports that the numbness in her hands began at the same time that N/T in her LE's began. However, her LE N/T has since subsided following conservative therapy. She reports gaining 30 pounds over the last 6 months that may be associated with increased eating and sedentary lifestyle. She also reports unrelenting night pain in her R posterior shoulder that wakes her up from her sleep; however this can be improved using a pillow under her arm when sleeping. She adds that she vapes daily and smokes 2 cigarettes each day. She denies any nausea/ vomiting or UE weakness. 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 She also denies any shoulder injuries in the past.    Limitations Lifting;House hold activities    How long can you sit comfortably? Unlimited    How long can you stand comfortably? Unlimited    How long can you walk comfortably? Unlimited    Diagnostic tests None related to current problem    Patient Stated Goals Laundry, working out, sleeping    Currently in Pain? Yes    Pain Score 8     Pain Location Shoulder    Pain Orientation Right    Pain Descriptors / Indicators Burning;Sharp    Pain Type Chronic pain    Pain Radiating Towards BIL hands    Pain Onset More than a month ago    Pain Frequency Intermittent    Aggravating Factors  Lifting, laying on affected shoulder    Pain Relieving Factors Muscle relaxers    Effect of Pain on Daily Activities ADL's, laundry, patient care    Multiple Pain Sites Yes    Pain Score 7    Pain Location Neck    Pain Orientation Posterior    Pain Descriptors / Indicators Cramping;Aching    Pain Onset More than a month ago    Pain Frequency Intermittent  [facility] PT Assessment - 01/18/21 0001       Assessment   Medical Diagnosis Chronic right shoulder pain (M25.511, G89.29), Neck pain (M54.2)    Referring Provider (PT) Juluis Mire P    Onset Date/Surgical Date 01/19/19    Hand Dominance Right    Next MD Visit Next month    Prior Therapy Yes, for lumbar pain 2 years ago      Precautions   Precautions None      Restrictions   Weight Bearing Restrictions No      Balance Screen   Has the patient fallen in the past 6 months No    Has the patient had a decrease in activity level because of a fear of falling?  No    Is the patient reluctant to leave their home because of a fear of falling?  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   Vocation Requirements Patient care    Leisure Working out      ROM /  Strength   AROM / PROM / Strength AROM;Strength      AROM   Overall AROM Comments BIL shoulder AROM equal and WNL in all planes      Strength   Overall Strength Comments BIL Shoulder MMT 5/5 with exception of R ER= 4+/5 with mild pain   BIL global scapular strength 3/5 in all planes     Palpation   Spinal mobility cervical and thoracic CPA's hypomobile and painful C1-T10, reproduction of R UE N/T with CPA's to C4-T3    Palpation comment TTP to BIL cervical and thoracic paraspinals      Special Tests    Special Tests Cervical;Rotator Cuff Impingement    Cervical Tests Spurling's;Dictraction;Vertebral Artery Test;other;other2      Spurling's   Findings Negative      Distraction Test   Findngs Negative      Vertebral Artery Test    Findings Negative    Side --   BIL     other    Findings Positive    Side Right    Comment Median Nerve ULNTT      other    Findings Positive    Comment Supine DNF endurance test (lost form at 18 sec)      Neer Impingement test    Findings Negative    Comments BIL      Hawkins-Kennedy test   Findings Negative    Comments BIL      Drop Arm test   Findings Negative    Comment BIL      Painful Arc of Motion   Findings Negative    Comments BIL      other   Findings Negative    Side --   BIL   Comments Hoffman's test for myelopathy                        Objective measurements completed on examination: See above findings.       [facility] Adult PT Treatment/Exercise - 01/18/21 0001       Manual Therapy   Manual Therapy Joint mobilization    Joint Mobilization Grade V prone CT junction manipulation x1 each side with cavitation                    PT Education - 01/18/21 1556  Education Details Pt educated on potential underlying physiology behind her concordant sxs, as well as proper form when performing HEP.    Person(s) Educated Patient    Methods Explanation;Demonstration;Handout    Comprehension  Verbalized understanding;Returned demonstration              PT Short Term Goals - 01/18/21 1653       PT SHORT TERM GOAL #1   Title Pt will report understanding and adherence of her HEP in order to promote independence in the management of her primary impairments.    Baseline HEP given at eval    Time 4    Period Weeks    Status New    Target Date 02/15/21      PT SHORT TERM GOAL #2   Title -    Baseline -      PT SHORT TERM GOAL #3   Title -    Baseline -               PT Long Term Goals - 01/18/21 1655       PT LONG TERM GOAL #1   Title Pt will demonstrate 4+/5 strength in BIL middle trap, middle trap, and lat MMT in order to promote good scapular/ cervical posture.    Baseline 3/5 in BIL middle trap, lower trap, and lats    Time 8    Period Weeks    Status New    Target Date 03/15/21      PT LONG TERM GOAL #2   Title Pt will achieve supine DNF endurance test of 45 seconds or higher in order to promote WNL cervical posture and offer prophylaxis for future cervical pain.    Baseline 18 seconds    Time 8    Period Weeks    Status New    Target Date 03/15/21      PT LONG TERM GOAL #3   Title Pt will report average pain of 0-4/10 in order to be less limited when performing ADL's.    Baseline 8/10 baseline pain    Time 8    Period Weeks    Status New    Target Date 03/15/21                    Plan - 01/18/21 1604     Clinical Impression Statement Pt is a pleasant 40yo F who presents to clinic with primary c/o of chronic R shoulder/ cervical pain of insidious onset lasting about 1 and a half years. Upon assessment, her primary impairments include weak BIL parascapular strength, weak deep neck flexor strength, hypomobile and painful cervical and thoracic passive accessories, and positive median nerve neural tension testing. Her impairments are most in line with cervical radiculopathy vs. facet dysfunction. Low concern for RTC pathology. Following  CT junction manipulation, the pt demonstrated improved cervical and upper thoracic passive accessories, as well as a decrease in pain from 7-8/10 to 5/10. She will benefit from skilled PT to address her primary impairments and return to her prior level of function without limitation.    Personal Factors and Comorbidities Comorbidity 1;Fitness    Comorbidities HTN    Examination-Activity Limitations Caring for Others;Carry;Sleep;Lift    Examination-Participation Restrictions Laundry;Occupation;Cleaning    Stability/Clinical Decision Making Evolving/Moderate complexity    Clinical Decision Making Moderate    Rehab Potential Good    PT Frequency 1x / week    PT Duration 8 weeks    PT Treatment/Interventions ADLs/Self Care Home  Management;Biofeedback;Cryotherapy;Electrical Stimulation;Traction;Moist Heat;Therapeutic activities;Therapeutic exercise;Neuromuscular re-education;Manual techniques;Joint Manipulations;Spinal Manipulations;Dry needling;Passive range of motion;Patient/family education;Taping    PT Next Visit Plan Assess UE sensation, cervical ROM; progress DNF/ scapular strengthening, prescribe median nerve glide to HEP, CT junction/ thoracic manipulation PRN    PT Home Exercise Plan [id number]    Consulted and Agree with Plan of Care Patient             Patient will benefit from skilled therapeutic intervention in order to improve the following deficits and impairments:  Impaired UE functional use, Obesity, Decreased endurance, Pain, Impaired perceived functional ability, Hypomobility, Impaired flexibility, Postural dysfunction, Impaired sensation, Decreased strength  Visit Diagnosis: Cervicalgia  Chronic right shoulder pain  Muscle weakness (generalized)  Other disturbances of skin sensation     Problem List Patient Active Problem List   Diagnosis Date Noted   Chronic right shoulder pain 01/04/2021   Numbness and tingling in right hand 01/04/2021   Essential hypertension  01/04/2021    Vanessa [place], PT, DPT 01/18/21 5:04 PM   [facility] [address] [place], [place], [address] Phone: [phone]   Fax:  [phone]  Name: Jill Stevenson MRN: [id number] Date of Birth: 03/30/1981

## 2021-01-25 ENCOUNTER — Ambulatory Visit: Payer: Medicaid Other

## 2021-01-25 ENCOUNTER — Other Ambulatory Visit: Payer: Self-pay

## 2021-01-25 DIAGNOSIS — M25511 Pain in right shoulder: Secondary | ICD-10-CM

## 2021-01-25 DIAGNOSIS — M6281 Muscle weakness (generalized): Secondary | ICD-10-CM

## 2021-01-25 DIAGNOSIS — M542 Cervicalgia: Secondary | ICD-10-CM | POA: Diagnosis not present

## 2021-01-25 DIAGNOSIS — G8929 Other chronic pain: Secondary | ICD-10-CM

## 2021-01-25 NOTE — Therapy (Signed)
[place], [place], [address] Phone: [phone]   Fax:  [phone]  Physical Therapy Treatment  Patient Details  Name: Jill Stevenson MRN: [id number] Date of Birth: [birth year] Referring Provider (PT): Juluis Mire P   Encounter Date: 01/25/2021   PT End of Session - 01/25/21 1452     Visit Number 2    Number of Visits 9    Date for PT Re-Evaluation 03/15/21    Authorization Type [employer] MCD    Authorization - Visit Number 2    Authorization - Number of Visits 27    Progress Note Due on Visit 10    PT Start Time 1450   arrived late   PT Stop Time 1530    PT Time Calculation (min) 40 min    Activity Tolerance Patient tolerated treatment well    Behavior During Therapy [facility] for tasks assessed/performed             Past Medical History:  Diagnosis Date   Hypertension    Medical history non-contributory     Past Surgical History:  Procedure Laterality Date   CESAREAN SECTION      There were no vitals filed for this visit.   Subjective Assessment - 01/25/21 1453     Subjective Pt presents to PT with reports of continued neck and R shoulder pain. She has been compliant with her HEP with no adverse effect. Pt is ready to begin PT treatment at this time.    Currently in Pain? Yes    Pain Score 5     Pain Location Shoulder    Pain Orientation Right    Pain Score 5    Pain Location Neck    Pain Orientation Posterior           [facility] Adult PT Treatment/Exercise:   Therapeutic Exercise:  NuStep lvl 5 UE/LE while taking subjective Rows 2x12 black tband Shoulder ext w/ scap 2x10 black tband Cervical ext SNAG 2x10 - 5 sec hold Cervical rot SNAG x 5 ea Supine chin tuck x 10 - 5 sec hold Cat Cow x 5  Manual Therapy: Suboccipital release PA glides Grade III T2-4 Prone thrust manipulation to upper thoracic spine Grade V                              PT Short  Term Goals - 01/18/21 1653       PT SHORT TERM GOAL #1   Title Pt will report understanding and adherence of her HEP in order to promote independence in the management of her primary impairments.    Baseline HEP given at eval    Time 4    Period Weeks    Status New    Target Date 02/15/21      PT SHORT TERM GOAL #2   Title -    Baseline -      PT SHORT TERM GOAL #3   Title -    Baseline -               PT Long Term Goals - 01/18/21 1655       PT LONG TERM GOAL #1   Title Pt will demonstrate 4+/5 strength in BIL middle trap, middle trap, and lat MMT in order to promote good scapular/ cervical posture.    Baseline 3/5 in BIL middle trap, lower trap, and lats    Time 8  Period Weeks    Status New    Target Date 03/15/21      PT LONG TERM GOAL #2   Title Pt will achieve supine DNF endurance test of 45 seconds or higher in order to promote WNL cervical posture and offer prophylaxis for future cervical pain.    Baseline 18 seconds    Time 8    Period Weeks    Status New    Target Date 03/15/21      PT LONG TERM GOAL #3   Title Pt will report average pain of 0-4/10 in order to be less limited when performing ADL's.    Baseline 8/10 baseline pain    Time 8    Period Weeks    Status New    Target Date 03/15/21                   Plan - 01/25/21 1455     Clinical Impression Statement Pt was able to complete all prescribed exercises with no adverse effect or increase in pain. She responded well to manual therapy interventions, noting decreased pain at end of session. Today we focused on increasing periscapular strength and thoracic/cervical mobility. Will continue to progress as tolerated per POC.    PT Treatment/Interventions ADLs/Self Care Home Management;Biofeedback;Cryotherapy;Electrical Stimulation;Traction;Moist Heat;Therapeutic activities;Therapeutic exercise;Neuromuscular re-education;Manual techniques;Joint Manipulations;Spinal Manipulations;Dry  needling;Passive range of motion;Patient/family education;Taping    PT Next Visit Plan Assess UE sensation, cervical ROM; progress DNF/ scapular strengthening, prescribe median nerve glide to HEP, CT junction/ thoracic manipulation PRN    PT Home Exercise Plan [id number]             Patient will benefit from skilled therapeutic intervention in order to improve the following deficits and impairments:  Impaired UE functional use, Obesity, Decreased endurance, Pain, Impaired perceived functional ability, Hypomobility, Impaired flexibility, Postural dysfunction, Impaired sensation, Decreased strength  Visit Diagnosis: Cervicalgia  Chronic right shoulder pain  Muscle weakness (generalized)     Problem List Patient Active Problem List   Diagnosis Date Noted   Chronic right shoulder pain 01/04/2021   Numbness and tingling in right hand 01/04/2021   Essential hypertension 01/04/2021    Ward Chatters, PT, DPT 01/25/21 3:32 PM  [facility] [address] [place], [place], [address] Phone: [phone]   Fax:  [phone]  Name: Jill Stevenson MRN: [id number] Date of Birth: December 26, 1980

## 2021-02-01 ENCOUNTER — Telehealth: Payer: Self-pay

## 2021-02-01 ENCOUNTER — Ambulatory Visit: Payer: Medicaid Other | Attending: Family Medicine

## 2021-02-01 DIAGNOSIS — G8929 Other chronic pain: Secondary | ICD-10-CM | POA: Insufficient documentation

## 2021-02-01 DIAGNOSIS — M6281 Muscle weakness (generalized): Secondary | ICD-10-CM | POA: Insufficient documentation

## 2021-02-01 DIAGNOSIS — M542 Cervicalgia: Secondary | ICD-10-CM | POA: Insufficient documentation

## 2021-02-01 DIAGNOSIS — R208 Other disturbances of skin sensation: Secondary | ICD-10-CM | POA: Insufficient documentation

## 2021-02-01 DIAGNOSIS — M25511 Pain in right shoulder: Secondary | ICD-10-CM | POA: Insufficient documentation

## 2021-02-01 NOTE — Telephone Encounter (Signed)
PT called and LVM regarding missed visit, attendance policy, and reminder of next appointment.   Ward Chatters, PT, DPT 02/01/21 5:31 PM

## 2021-02-04 ENCOUNTER — Other Ambulatory Visit: Payer: Self-pay

## 2021-02-04 ENCOUNTER — Encounter (INDEPENDENT_AMBULATORY_CARE_PROVIDER_SITE_OTHER): Payer: Self-pay | Admitting: Primary Care

## 2021-02-04 ENCOUNTER — Ambulatory Visit (INDEPENDENT_AMBULATORY_CARE_PROVIDER_SITE_OTHER): Payer: Medicaid Other | Admitting: Primary Care

## 2021-02-04 VITALS — BP 133/94 | HR 90 | Temp 97.3°F | Resp 20 | Ht 72.0 in | Wt 387.0 lb

## 2021-02-04 DIAGNOSIS — Z6841 Body Mass Index (BMI) 40.0 and over, adult: Secondary | ICD-10-CM | POA: Diagnosis not present

## 2021-02-04 DIAGNOSIS — N921 Excessive and frequent menstruation with irregular cycle: Secondary | ICD-10-CM | POA: Diagnosis not present

## 2021-02-04 DIAGNOSIS — Z13220 Encounter for screening for lipoid disorders: Secondary | ICD-10-CM

## 2021-02-04 DIAGNOSIS — I10 Essential (primary) hypertension: Secondary | ICD-10-CM

## 2021-02-04 LAB — POCT GLYCOSYLATED HEMOGLOBIN (HGB A1C): HbA1c, POC (prediabetic range): 6 % (ref 5.7–6.4)

## 2021-02-04 MED ORDER — AMLODIPINE BESYLATE 5 MG PO TABS: 5.0000 mg | ORAL_TABLET | Freq: Every day | ORAL | 0 refills | Status: DC

## 2021-02-04 NOTE — Progress Notes (Signed)
F/u BP

## 2021-02-04 NOTE — Progress Notes (Signed)
[place] is a 40 y.o. female presents for hypertension evaluation, Denies shortness of breath, headaches, chest pain or lower extremity edema, sudden onset, vision changes, unilateral weakness, dizziness, paresthesias   Patient reports adherence with medications.  Dietary habits include: low sodium and carbs Exercise habits include:yes walking Family / Social history: None   Past Medical History:  Diagnosis Date   Hypertension    Medical history non-contributory    Past Surgical History:  Procedure Laterality Date   CESAREAN SECTION     No Known Allergies Current Outpatient Medications on File Prior to Visit  Medication Sig Dispense Refill   acetaminophen (TYLENOL) 325 MG tablet Take 650 mg by mouth every 6 (six) hours as needed for mild pain, fever or headache.     etonogestrel-ethinyl estradiol (NUVARING) 0.12-0.015 MG/24HR vaginal ring Insert vaginally and leave in place for 3 consecutive weeks, then remove for 1 week. 1 each 12   hydrochlorothiazide (HYDRODIURIL) 25 MG tablet Take 1 tablet (25 mg total) by mouth daily. 90 tablet 1   tiZANidine (ZANAFLEX) 4 MG tablet Take 1 tablet (4 mg total) by mouth every 6 (six) hours as needed for muscle spasms. 30 tablet 0   calcium-vitamin D (OSCAL WITH D) 500-200 MG-UNIT tablet Take 1 tablet by mouth daily with breakfast for 7 days. 7 tablet 0   No current facility-administered medications on file prior to visit.   Social History   Socioeconomic History   Marital status: Single    Spouse name: Not on file   Number of children: Not on file   Years of education: Not on file   Highest education level: Not on file  Occupational History   Not on file  Tobacco Use   Smoking status: Some Days    Packs/day: 0.25    Types: Cigarettes   Smokeless tobacco: Never  Substance and Sexual Activity   Alcohol use: Yes    Comment: occasionally   Drug use: No   Sexual activity: Not on file  Other Topics  Concern   Not on file  Social History Narrative   Not on file   Social Determinants of Health   Financial Resource Strain: Not on file  Food Insecurity: Not on file  Transportation Needs: Not on file  Physical Activity: Not on file  Stress: Not on file  Social Connections: Not on file  Intimate Partner Violence: Not on file   Family History  Problem Relation Age of Onset   Breast cancer Maternal Aunt      OBJECTIVE:  Vitals:   02/04/21 1025  BP: (!) 133/94  Pulse: 90  Resp: 20  Temp: (!) 97.3 F (36.3 C)  SpO2: 100%  Weight: (!) 387 lb (175.5 kg)  Height: 6' (1.829 m)    Physical Exam Vitals reviewed.  Constitutional:      Appearance: Normal appearance. She is obese.  HENT:     Head: Normocephalic and atraumatic.     Right Ear: Tympanic membrane and external ear normal.     Left Ear: Tympanic membrane and external ear normal.     Nose: Nose normal.  Eyes:     Extraocular Movements: Extraocular movements intact.     Pupils: Pupils are equal, round, and reactive to light.  Cardiovascular:     Rate and Rhythm: Normal rate and regular rhythm.  Pulmonary:     Effort: Pulmonary effort is normal.     Breath sounds: Normal breath sounds.  Abdominal:  General: Bowel sounds are normal. There is distension.     Palpations: Abdomen is soft.  Musculoskeletal:        General: Normal range of motion.     Cervical back: Normal range of motion and neck supple.  Skin:    General: Skin is warm and dry.  Neurological:     Mental Status: She is alert and oriented to person, place, and time.  Psychiatric:        Mood and Affect: Mood normal.        Behavior: Behavior normal.        Thought Content: Thought content normal.        Judgment: Judgment normal.    Review of Systems  All other systems reviewed and are negative.  Last 3 Office BP readings: BP Readings from Last 3 Encounters:  02/04/21 (!) 133/94  01/04/21 (!) 182/103  09/08/20 (!) 138/94    BMET     Component Value Date/Time   NA 139 09/08/2020 1234   NA 140 03/27/2019 1110   K 3.0 (L) 09/08/2020 1234   CL 98 09/08/2020 1234   CO2 32 09/08/2020 1234   GLUCOSE 80 09/08/2020 1234   BUN 12 09/08/2020 1234   BUN 12 03/27/2019 1110   CREATININE 0.94 09/08/2020 1234   CALCIUM 8.6 (L) 09/08/2020 1234   GFRNONAA >60 09/08/2020 1234   GFRAA 90 03/27/2019 1110    Renal function: CrCl cannot be calculated (Patient's most recent lab result is older than the maximum 21 days allowed.).  Clinical ASCVD: No  The ASCVD Risk score (Arnett DK, et al., 2019) failed to calculate for the following reasons:   The 2019 ASCVD risk score is only valid for ages 11 to 53  ASCVD risk factors include- [place]   ASSESSMENT & PLAN: Diagnoses and all orders for this visit:  Class 3 severe obesity due to excess calories in adult, unspecified BMI, unspecified whether serious comorbidity present ([place]) -     Amb ref to Medical Nutrition Therapy-MNT -     Cancel: HgB A1c -     HgB A1c  Lipid screening -     Lipid panel  Menorrhagia with irregular cycle -     CBC with Differential   Essential hypertension -Counseled on lifestyle modifications for blood pressure control including reduced dietary sodium, increased exercise, weight reduction and adequate sleep. Also, educated patient about the risk for cardiovascular events, stroke and heart attack. Also counseled patient about the importance of medication adherence. If you participate in smoking, it is important to stop using tobacco as this will increase the risks associated with uncontrolled blood pressure.   -Hypertension longstanding diagnosed currently HCTZ 54m  on current medications. Patient is adherent with current medications.   Goal BP:  For patients younger than 60: Goal BP < 130/80. For patients 60 and older: Goal BP < 140/90. For patients with diabetes: Goal BP < 130/80. Your most recent BP: 133/94 Added Amlodipine 518mdaily Minimize salt  intake. Minimize alcohol intake -     amLODipine (NORVASC) 5 MG tablet; Take 1 tablet (5 mg total) by mouth daily. -     CMP14+EGFR   This note has been created with [employer], quantityAny transcriptional errors are unintentional.   MiKerin PernaNP 02/04/2021, 10:44 AM

## 2021-02-04 NOTE — Patient Instructions (Signed)

## 2021-02-05 LAB — LIPID PANEL
Chol/HDL Ratio: 2.3 ratio (ref 0.0–4.4)
Cholesterol, Total: 179 mg/dL (ref 100–199)
HDL: 79 mg/dL (ref >39)
LDL Chol Calc (NIH): 88 mg/dL (ref 0–99)
Triglycerides: 62 mg/dL (ref 0–149)
VLDL Cholesterol Cal: 12 mg/dL (ref 5–40)

## 2021-02-05 LAB — CMP14+EGFR
ALT: 28 IU/L (ref 0–32)
AST: 16 IU/L (ref 0–40)
Albumin/Globulin Ratio: 1.4 (ref 1.2–2.2)
Albumin: 4.3 g/dL (ref 3.8–4.8)
Alkaline Phosphatase: 94 IU/L (ref 44–121)
BUN/Creatinine Ratio: 14 (ref 9–23)
BUN: 12 mg/dL (ref 6–20)
Bilirubin Total: <0.2 mg/dL (ref 0.0–1.2)
CO2: 26 mmol/L (ref 20–29)
Calcium: 9.2 mg/dL (ref 8.7–10.2)
Chloride: 97 mmol/L (ref 96–106)
Creatinine, Ser: 0.87 mg/dL (ref 0.57–1.00)
Globulin, Total: 3.1 g/dL (ref 1.5–4.5)
Glucose: 99 mg/dL (ref 65–99)
Potassium: 3.9 mmol/L (ref 3.5–5.2)
Sodium: 138 mmol/L (ref 134–144)
Total Protein: 7.4 g/dL (ref 6.0–8.5)
eGFR: 87 mL/min/{1.73_m2} (ref >59)

## 2021-02-05 LAB — CBC WITH DIFFERENTIAL/PLATELET
Basophils Absolute: 0 10*3/uL (ref 0.0–0.2)
Basos: 1 %
EOS (ABSOLUTE): 0.2 10*3/uL (ref 0.0–0.4)
Eos: 2 %
Hematocrit: 40.6 % (ref 34.0–46.6)
Hemoglobin: 13.1 g/dL (ref 11.1–15.9)
Immature Grans (Abs): 0 10*3/uL (ref 0.0–0.1)
Immature Granulocytes: 0 %
Lymphocytes Absolute: 2.8 10*3/uL (ref 0.7–3.1)
Lymphs: 35 %
MCH: 28.2 pg (ref 26.6–33.0)
MCHC: 32.3 g/dL (ref 31.5–35.7)
MCV: 88 fL (ref 79–97)
Monocytes Absolute: 0.6 10*3/uL (ref 0.1–0.9)
Monocytes: 7 %
Neutrophils Absolute: 4.4 10*3/uL (ref 1.4–7.0)
Neutrophils: 55 %
Platelets: 223 10*3/uL (ref 150–450)
RBC: 4.64 x10E6/uL (ref 3.77–5.28)
RDW: 13.6 % (ref 11.7–15.4)
WBC: 8.1 10*3/uL (ref 3.4–10.8)

## 2021-02-08 ENCOUNTER — Ambulatory Visit: Payer: Medicaid Other

## 2021-02-08 ENCOUNTER — Other Ambulatory Visit: Payer: Self-pay

## 2021-02-08 DIAGNOSIS — R208 Other disturbances of skin sensation: Secondary | ICD-10-CM | POA: Diagnosis present

## 2021-02-08 DIAGNOSIS — M6281 Muscle weakness (generalized): Secondary | ICD-10-CM | POA: Diagnosis present

## 2021-02-08 DIAGNOSIS — M542 Cervicalgia: Secondary | ICD-10-CM

## 2021-02-08 DIAGNOSIS — G8929 Other chronic pain: Secondary | ICD-10-CM | POA: Diagnosis present

## 2021-02-08 DIAGNOSIS — M25511 Pain in right shoulder: Secondary | ICD-10-CM | POA: Diagnosis present

## 2021-02-08 NOTE — Therapy (Signed)
[place], [place], [address] Phone: [phone]   Fax:  [phone]  Physical Therapy Treatment  Patient Details  Name: Jill Stevenson MRN: [id number] Date of Birth: 02-Jun-1980 Referring Provider (PT): Juluis Mire P   Encounter Date: 02/08/2021   PT End of Session - 02/08/21 1457     Visit Number 3    Number of Visits 9    Date for PT Re-Evaluation 03/15/21    Authorization Type U[employer] MCD    Authorization - Visit Number 3    Authorization - Number of Visits 27    Progress Note Due on Visit 10    PT Start Time 1455   arrived late   PT Stop Time 1527    PT Time Calculation (min) 32 min    Activity Tolerance Patient tolerated treatment well    Behavior During Therapy [facility] for tasks assessed/performed             Past Medical History:  Diagnosis Date   Hypertension    Medical history non-contributory     Past Surgical History:  Procedure Laterality Date   CESAREAN SECTION      There were no vitals filed for this visit.   Subjective Assessment - 02/08/21 1457     Subjective Pt presents to PT with continued reports of neck and R UE pain and discomfort. Pt continues compliance with HEP per reports. Pt is ready to begin PT treatment at this time.    Currently in Pain? Yes    Pain Score 7     Pain Location Neck    Pain Orientation Right    Pain Radiating Towards R UE           [facility] Adult PT Treatment/Exercise:   Therapeutic Exercise:  UBE lvl 1.0  x 3 min while taking subjective Rows 2x10 10lbs Shoulder ext w/ scap 2x10 10lbs  Seated thoracic ext over soft foam 2x10 R median nerve glide 2x10 Supine horizontal abd 2x10 blue tband Supine chin tuck x 10 - 5 sec hold Cat Cow x 5  Interventions Not Performed Today: Cervical ext SNAG 2x10 - 5 sec hold Cervical rot SNAG x 5 ea   Manual Therapy(NOT TODAY): Suboccipital release PA glides Grade III T2-4 Prone thrust manipulation  to upper thoracic spine Grade V                               PT Short Term Goals - 01/18/21 1653       PT SHORT TERM GOAL #1   Title Pt will report understanding and adherence of her HEP in order to promote independence in the management of her primary impairments.    Baseline HEP given at eval    Time 4    Period Weeks    Status New    Target Date 02/15/21      PT SHORT TERM GOAL #2   Title -    Baseline -      PT SHORT TERM GOAL #3   Title -    Baseline -               PT Long Term Goals - 01/18/21 1655       PT LONG TERM GOAL #1   Title Pt will demonstrate 4+/5 strength in BIL middle trap, middle trap, and lat MMT in order to promote good scapular/ cervical posture.  Baseline 3/5 in BIL middle trap, lower trap, and lats    Time 8    Period Weeks    Status New    Target Date 03/15/21      PT LONG TERM GOAL #2   Title Pt will achieve supine DNF endurance test of 45 seconds or higher in order to promote WNL cervical posture and offer prophylaxis for future cervical pain.    Baseline 18 seconds    Time 8    Period Weeks    Status New    Target Date 03/15/21      PT LONG TERM GOAL #3   Title Pt will report average pain of 0-4/10 in order to be less limited when performing ADL's.    Baseline 8/10 baseline pain    Time 8    Period Weeks    Status New    Target Date 03/15/21                   Plan - 02/08/21 1507     Clinical Impression Statement Pt was able to complete prescribed exercises with no adverse effect or change in baseline pain. HEP updated with median nerve glide and thoracic mobilizaiton for decrease pain and neural tension. Today we also focused on increasing periscapular strength and DNF endurance for improving posture and decrease pain. Will assess response to continued interventions and progress as tolerated.    PT Treatment/Interventions ADLs/Self Care Home Management;Biofeedback;Cryotherapy;Electrical  Stimulation;Traction;Moist Heat;Therapeutic activities;Therapeutic exercise;Neuromuscular re-education;Manual techniques;Joint Manipulations;Spinal Manipulations;Dry needling;Passive range of motion;Patient/family education;Taping    PT Next Visit Plan progress DNF/ scapular strengthening, CT junction/ thoracic manipulation PRN    PT Home Exercise Plan [id number]             Patient will benefit from skilled therapeutic intervention in order to improve the following deficits and impairments:  Impaired UE functional use, Obesity, Decreased endurance, Pain, Impaired perceived functional ability, Hypomobility, Impaired flexibility, Postural dysfunction, Impaired sensation, Decreased strength  Visit Diagnosis: Cervicalgia  Chronic right shoulder pain     Problem List Patient Active Problem List   Diagnosis Date Noted   Chronic right shoulder pain 01/04/2021   Numbness and tingling in right hand 01/04/2021   Essential hypertension 01/04/2021    Ward Chatters, PT 02/08/2021, 3:28 PM  [facility] [address] [place], [place], [address] Phone: [phone]   Fax:  [phone]  Name: Jill Stevenson MRN: [id number] Date of Birth: November 23, 1980

## 2021-02-15 ENCOUNTER — Telehealth: Payer: Self-pay

## 2021-02-15 ENCOUNTER — Ambulatory Visit: Payer: Medicaid Other

## 2021-02-15 ENCOUNTER — Other Ambulatory Visit (INDEPENDENT_AMBULATORY_CARE_PROVIDER_SITE_OTHER): Payer: Self-pay | Admitting: Nurse Practitioner

## 2021-02-15 DIAGNOSIS — G8929 Other chronic pain: Secondary | ICD-10-CM

## 2021-02-15 DIAGNOSIS — R202 Paresthesia of skin: Secondary | ICD-10-CM

## 2021-02-15 DIAGNOSIS — R200 Anesthesia of skin: Secondary | ICD-10-CM

## 2021-02-15 NOTE — Telephone Encounter (Signed)
Spoke with pt regarding her 2nd no-show. Discussed the clinic attendance policy, to which the pt agreed. Confirmed the pt's next appointment and informed pt that all other future appointments would be cancelled and she will be required to schedule 1 visit at a time moving forward.

## 2021-02-22 ENCOUNTER — Ambulatory Visit: Payer: Medicaid Other

## 2021-02-22 ENCOUNTER — Other Ambulatory Visit: Payer: Self-pay

## 2021-02-22 ENCOUNTER — Other Ambulatory Visit (INDEPENDENT_AMBULATORY_CARE_PROVIDER_SITE_OTHER): Payer: Self-pay | Admitting: Nurse Practitioner

## 2021-02-22 DIAGNOSIS — M25511 Pain in right shoulder: Secondary | ICD-10-CM

## 2021-02-22 DIAGNOSIS — G8929 Other chronic pain: Secondary | ICD-10-CM

## 2021-02-22 DIAGNOSIS — M542 Cervicalgia: Secondary | ICD-10-CM

## 2021-02-22 DIAGNOSIS — M6281 Muscle weakness (generalized): Secondary | ICD-10-CM

## 2021-02-22 DIAGNOSIS — R200 Anesthesia of skin: Secondary | ICD-10-CM

## 2021-02-22 DIAGNOSIS — R208 Other disturbances of skin sensation: Secondary | ICD-10-CM

## 2021-02-22 NOTE — Therapy (Signed)
[place], [place], [address] Phone: [phone]   Fax:  [phone]  Physical Therapy Treatment  Patient Details  Name: Jill Stevenson MRN: [id number] Date of Birth: 03-09-1981 Referring Provider (PT): Juluis Mire P   Encounter Date: 02/22/2021   PT End of Session - 02/22/21 1509     Visit Number 4    Number of Visits 9    Date for PT Re-Evaluation 03/15/21    Authorization Type UHC MCD    Authorization - Visit Number 4    Authorization - Number of Visits 27    Progress Note Due on Visit 10    PT Start Time 6010    PT Stop Time 1530    PT Time Calculation (min) 45 min    Activity Tolerance Patient tolerated treatment well    Behavior During Therapy [facility] for tasks assessed/performed             Past Medical History:  Diagnosis Date   Hypertension    Medical history non-contributory     Past Surgical History:  Procedure Laterality Date   CESAREAN SECTION      There were no vitals filed for this visit.   Subjective Assessment - 02/22/21 1447     Subjective Pt reports that over the past week, she has had limited and painful cervical range of motion, adding that she has a "crook" in her neck. She reports doing her HEP every day with occasional pain relief with her exercises.    Currently in Pain? Yes    Pain Score 8     Pain Location Neck    Pain Orientation Right    Pain Descriptors / Indicators Aching    Pain Type Chronic pain    Pain Onset More than a month ago    Pain Frequency Intermittent                               [facility] Adult PT Treatment/Exercise - 02/22/21 0001       Neck Exercises: Machines for Strengthening   Cybex Row High and low row 2x10 with 25#    Lat Pull 2x10 with 25#      Neck Exercises: Standing   Upper Extremity D2 Flexion    Theraband Level (UE D2) Level 1 (Yellow)    UE D2 Limitations 2x10 BIL    Other Standing Exercises  Shoulder rolls 2x15 forward, 2x15 backward with 10# DB's      Manual Therapy   Manual Therapy Joint mobilization;Soft tissue mobilization    Joint Mobilization Grade V prone CT junction manipulation x1 each side with cavitation; grade V prone thoracic manipulation x5 throughout thoracic spine with cavitations    Soft tissue mobilization STM to BIL cervical paraspinals                     PT Education - 02/22/21 1518     Education Details Updated HEP    Person(s) Educated Patient    Methods Explanation;Demonstration;Handout    Comprehension Verbalized understanding;Returned demonstration              PT Short Term Goals - 01/18/21 1653       PT SHORT TERM GOAL #1   Title Pt will report understanding and adherence of her HEP in order to promote independence in the management of her primary impairments.    Baseline HEP given at  eval    Time 4    Period Weeks    Status New    Target Date 02/15/21      PT SHORT TERM GOAL #2   Title -    Baseline -      PT SHORT TERM GOAL #3   Title -    Baseline -               PT Long Term Goals - 01/18/21 1655       PT LONG TERM GOAL #1   Title Pt will demonstrate 4+/5 strength in BIL middle trap, middle trap, and lat MMT in order to promote good scapular/ cervical posture.    Baseline 3/5 in BIL middle trap, lower trap, and lats    Time 8    Period Weeks    Status New    Target Date 03/15/21      PT LONG TERM GOAL #2   Title Pt will achieve supine DNF endurance test of 45 seconds or higher in order to promote WNL cervical posture and offer prophylaxis for future cervical pain.    Baseline 18 seconds    Time 8    Period Weeks    Status New    Target Date 03/15/21      PT LONG TERM GOAL #3   Title Pt will report average pain of 0-4/10 in order to be less limited when performing ADL's.    Baseline 8/10 baseline pain    Time 8    Period Weeks    Status New    Target Date 03/15/21                    Plan - 02/22/21 1509     Clinical Impression Statement Pt responded well to all interventions today with proper form and no increase in pain with selected exercises. She reports decrease in pain with CT junction and thoracic manipulations today from 8/10 to 5/10. Additionally, she demonstrates improved cervical and upper thoracic passive accessory mobility following this intervention. This improvement in pain persisted the entire treatment session. Additionally, she demonstrates improved functional strength today, as indicated by increased resistance with exercises. She will continue to benefit from skilled PT to address her primary impairments and return to her prior level of function without limitation.    Personal Factors and Comorbidities Comorbidity 1;Fitness    Comorbidities HTN    Examination-Activity Limitations Caring for Others;Carry;Sleep;Lift    Examination-Participation Restrictions Laundry;Occupation;Cleaning    Stability/Clinical Decision Making Evolving/Moderate complexity    Clinical Decision Making Moderate    Rehab Potential Good    PT Frequency 1x / week    PT Duration 8 weeks    PT Treatment/Interventions ADLs/Self Care Home Management;Biofeedback;Cryotherapy;Electrical Stimulation;Traction;Moist Heat;Therapeutic activities;Therapeutic exercise;Neuromuscular re-education;Manual techniques;Joint Manipulations;Spinal Manipulations;Dry needling;Passive range of motion;Patient/family education;Taping    PT Next Visit Plan progress DNF/ scapular strengthening, CT junction/ thoracic manipulation PRN    PT Home Exercise Plan [id number]    Consulted and Agree with Plan of Care Patient             Patient will benefit from skilled therapeutic intervention in order to improve the following deficits and impairments:  Impaired UE functional use, Obesity, Decreased endurance, Pain, Impaired perceived functional ability, Hypomobility, Impaired flexibility, Postural  dysfunction, Impaired sensation, Decreased strength  Visit Diagnosis: Cervicalgia  Chronic right shoulder pain  Muscle weakness (generalized)  Other disturbances of skin sensation     Problem List Patient Active Problem List  Diagnosis Date Noted   Chronic right shoulder pain 01/04/2021   Numbness and tingling in right hand 01/04/2021   Essential hypertension 01/04/2021    Vanessa [place], PT, DPT 02/22/21 3:26 PM   [facility] [address] [place], [place], [address] Phone: [phone]   Fax:  [phone]  Name: Jill Stevenson MRN: [id number] Date of Birth: [birth year]

## 2021-02-22 NOTE — Patient Instructions (Signed)
  [id number]

## 2021-03-01 ENCOUNTER — Ambulatory Visit: Payer: Medicaid Other

## 2021-03-02 ENCOUNTER — Other Ambulatory Visit: Payer: Self-pay

## 2021-03-02 ENCOUNTER — Ambulatory Visit: Payer: Medicaid Other | Attending: Family Medicine

## 2021-03-02 DIAGNOSIS — M6281 Muscle weakness (generalized): Secondary | ICD-10-CM | POA: Diagnosis present

## 2021-03-02 DIAGNOSIS — M542 Cervicalgia: Secondary | ICD-10-CM | POA: Diagnosis not present

## 2021-03-02 DIAGNOSIS — G8929 Other chronic pain: Secondary | ICD-10-CM | POA: Insufficient documentation

## 2021-03-02 DIAGNOSIS — R208 Other disturbances of skin sensation: Secondary | ICD-10-CM | POA: Insufficient documentation

## 2021-03-02 DIAGNOSIS — M25511 Pain in right shoulder: Secondary | ICD-10-CM | POA: Insufficient documentation

## 2021-03-02 NOTE — Therapy (Signed)
[place], [place], [address] Phone: [phone]   Fax:  [phone]  Physical Therapy Treatment  Patient Details  Name: Jill Stevenson MRN: [id number] Date of Birth: [birth year] Referring Provider (PT): Juluis Mire P   Encounter Date: 03/02/2021   PT End of Session - 03/02/21 1705     Visit Number 5    Number of Visits 9    Date for PT Re-Evaluation 03/15/21    Authorization Type [employer] MCD    Authorization - Visit Number 5    Authorization - Number of Visits 27    Progress Note Due on Visit 10    PT Start Time 1702    PT Stop Time 1740    PT Time Calculation (min) 38 min    Activity Tolerance Patient tolerated treatment well    Behavior During Therapy WFL for tasks assessed/performed             Past Medical History:  Diagnosis Date   Hypertension    Medical history non-contributory     Past Surgical History:  Procedure Laterality Date   CESAREAN SECTION      There were no vitals filed for this visit.   Subjective Assessment - 03/02/21 1706     Subjective Pt presents to PT with reports of increased pain in R shoulder and R upper trap. Had to cancel yesterdays appt d/t severe R shoulder pain. Has been compliant with her HEP with no adverse effect. Ready to begin PT at this time.    Currently in Pain? Yes    Pain Score 6     Pain Location Shoulder    Pain Orientation Right           [facility] Adult PT Treatment/Exercise:   Therapeutic Exercise:  NuStep lvl 5 x 4 min UE/LE while taking subjective Rows 2x12 20lbs Shoulder ext 2x10 20lbs R Shoulder IR/ER isometric 2x10 - 5 sec   Seated thoracic ext over soft foam 2x15 Supine horizontal abd 2x15 green tband Cat Cow x 5 S/L open book 2x10 ea Thoracic ext stretch w/ soft foam on wall x 10   Interventions Not Performed Today: Cervical ext SNAG 2x10 - 5 sec hold Cervical rot SNAG x 5 ea R median nerve glide 2x10 R median nerve  glide 2x10 Supine chin tuck x 10 - 5 sec hold                               PT Short Term Goals - 01/18/21 1653       PT SHORT TERM GOAL #1   Title Pt will report understanding and adherence of her HEP in order to promote independence in the management of her primary impairments.    Baseline HEP given at eval    Time 4    Period Weeks    Status New    Target Date 02/15/21      PT SHORT TERM GOAL #2   Title -    Baseline -      PT SHORT TERM GOAL #3   Title -    Baseline -               PT Long Term Goals - 01/18/21 1655       PT LONG TERM GOAL #1   Title Pt will demonstrate 4+/5 strength in BIL middle trap, middle trap, and lat MMT in order to  promote good scapular/ cervical posture.    Baseline 3/5 in BIL middle trap, lower trap, and lats    Time 8    Period Weeks    Status New    Target Date 03/15/21      PT LONG TERM GOAL #2   Title Pt will achieve supine DNF endurance test of 45 seconds or higher in order to promote WNL cervical posture and offer prophylaxis for future cervical pain.    Baseline 18 seconds    Time 8    Period Weeks    Status New    Target Date 03/15/21      PT LONG TERM GOAL #3   Title Pt will report average pain of 0-4/10 in order to be less limited when performing ADL's.    Baseline 8/10 baseline pain    Time 8    Period Weeks    Status New    Target Date 03/15/21                   Plan - 03/02/21 1725     Clinical Impression Statement Pt was able to complete all prescribed exercises with no adverse effect or increase in pain. Today's session continued to focus on improving shoulder and periscapular strength, as well as thoracic mobility. She notes 0/10 pain at end of session. She continues to benefit from skilled PT services working on improving strength and mobility in order to decrease pain and improve function.    PT Treatment/Interventions ADLs/Self Care Home  Management;Biofeedback;Cryotherapy;Electrical Stimulation;Traction;Moist Heat;Therapeutic activities;Therapeutic exercise;Neuromuscular re-education;Manual techniques;Joint Manipulations;Spinal Manipulations;Dry needling;Passive range of motion;Patient/family education;Taping    PT Next Visit Plan progress DNF/ scapular strengthening, CT junction/ thoracic manipulation PRN    PT Home Exercise Plan [id number]             Patient will benefit from skilled therapeutic intervention in order to improve the following deficits and impairments:  Impaired UE functional use, Obesity, Decreased endurance, Pain, Impaired perceived functional ability, Hypomobility, Impaired flexibility, Postural dysfunction, Impaired sensation, Decreased strength  Visit Diagnosis: Cervicalgia  Chronic right shoulder pain  Muscle weakness (generalized)  Other disturbances of skin sensation     Problem List Patient Active Problem List   Diagnosis Date Noted   Chronic right shoulder pain 01/04/2021   Numbness and tingling in right hand 01/04/2021   Essential hypertension 01/04/2021    Ward Chatters, PT 03/02/2021, 5:40 PM  [facility] [address] [place], [place], [address] Phone: [phone]   Fax:  [phone]  Name: Jill Stevenson MRN: [id number] Date of Birth: [birth year]

## 2021-03-15 ENCOUNTER — Ambulatory Visit: Payer: Medicaid Other

## 2021-03-18 ENCOUNTER — Encounter (INDEPENDENT_AMBULATORY_CARE_PROVIDER_SITE_OTHER): Payer: Self-pay

## 2021-03-18 ENCOUNTER — Ambulatory Visit (INDEPENDENT_AMBULATORY_CARE_PROVIDER_SITE_OTHER): Payer: Self-pay | Admitting: Primary Care

## 2021-03-23 ENCOUNTER — Ambulatory Visit: Payer: Medicaid Other

## 2021-03-23 ENCOUNTER — Other Ambulatory Visit: Payer: Self-pay

## 2021-03-23 DIAGNOSIS — M542 Cervicalgia: Secondary | ICD-10-CM

## 2021-03-23 DIAGNOSIS — M6281 Muscle weakness (generalized): Secondary | ICD-10-CM

## 2021-03-23 DIAGNOSIS — M25511 Pain in right shoulder: Secondary | ICD-10-CM

## 2021-03-23 DIAGNOSIS — G8929 Other chronic pain: Secondary | ICD-10-CM

## 2021-03-23 DIAGNOSIS — R208 Other disturbances of skin sensation: Secondary | ICD-10-CM

## 2021-03-23 NOTE — Therapy (Signed)
[place], [place], [address] Phone: [phone]   Fax:  [phone]  Physical Therapy Treatment/Re-Evaluation  Patient Details  Name: Jill Stevenson MRN: [id number] Date of Birth: 1980/11/13 Referring Provider (PT): Juluis Mire P   Encounter Date: 03/23/2021   PT End of Session - 03/23/21 1448     Visit Number 6    Number of Visits 12    Date for PT Re-Evaluation 05/04/21    Authorization Type UHC MCD    Authorization - Visit Number 6    Authorization - Number of Visits 27    Progress Note Due on Visit 10    PT Start Time 1448    PT Stop Time 1528    PT Time Calculation (min) 40 min    Activity Tolerance Patient tolerated treatment well    Behavior During Therapy [facility] for tasks assessed/performed             Past Medical History:  Diagnosis Date   Hypertension    Medical history non-contributory     Past Surgical History:  Procedure Laterality Date   CESAREAN SECTION      There were no vitals filed for this visit.   Subjective Assessment - 03/23/21 1448     Subjective Pt presents to PT with continued R shoulder and upper trap pain. She notes that she has days where she will be without pain for 48hrs, then it will come back for a few days. Pt is ready to begin PT treatment at this time.    Currently in Pain? Yes    Pain Score 5     Pain Location Shoulder    Pain Orientation Right    Pain Descriptors / Indicators Aching    Pain Radiating Towards R upper trap           [facility] Adult PT Treatment/Exercise:   Therapeutic Exercise:  UBE lvl 1.5 x 4 min while taking subjective Rows 2x12 20lbs Shoulder ext 2x10 20lbs   Seated thoracic ext over soft foam 2x15 Supine horizontal abd 2x15 green tband Cat Cow 2x10 S/L open book 2x10 ea Thoracic ext stretch w/ soft foam on wall 2x10 Total gym row 2x10 25lbs   Interventions Not Performed Today: Cervical ext SNAG 2x10 - 5 sec  hold Cervical rot SNAG x 5 ea R median nerve glide 2x10 R median nerve glide 2x10 Supine chin tuck x 10 - 5 sec hold R Shoulder IR/ER isometric 2x10 - 5 sec     [facility] PT Assessment - 03/23/21 0001       Strength   Overall Strength Comments BIL middle trap, lower trap, and lats: 4/5;  Deep cervical flexor endurance: 20 sec                                      PT Short Term Goals - 03/23/21 1514       PT SHORT TERM GOAL #1   Title Pt will report understanding and adherence of her HEP in order to promote independence in the management of her primary impairments.    Baseline HEP given at eval    Time 4    Period Weeks    Status Achieved    Target Date 02/15/21      PT SHORT TERM GOAL #2   Title -    Baseline -      PT  SHORT TERM GOAL #3   Title -    Baseline -               PT Long Term Goals - 03/23/21 1516       PT LONG TERM GOAL #1   Title Pt will demonstrate 4+/5 strength in BIL middle trap, middle trap, and lat MMT in order to promote good scapular/ cervical posture.    Baseline 3/5 in BIL middle trap, lower trap, and lats;  4/5 on 03/23/21    Time 8    Period Weeks    Status Partially Met    Target Date 05/04/21      PT LONG TERM GOAL #2   Title Pt will achieve supine DNF endurance test of 45 seconds or higher in order to promote WNL cervical posture and offer prophylaxis for future cervical pain.    Baseline 18 seconds; 20 seconds on 03/23/21    Time 8    Period Weeks    Status On-going    Target Date 05/04/21      PT LONG TERM GOAL #3   Title Pt will report average pain of 0-4/10 in order to be less limited when performing ADL's.    Baseline 8/10 baseline pain    Time 8    Period Weeks    Status On-going    Target Date 05/04/21                   Plan - 03/23/21 1711     Clinical Impression Statement Pt was able to complete prescribed exercises noting decrease in pain post session to 3/10. Today's session  we continued to work on improving thoracic mobility and periscapular strength, with pt continuing to show improvement in strength and functional activity tolerance. Over the course of PT treatment, pt has been able to improve strength and make progress towards LTGs. She would continue to benefit from skilled PT services working on improving strength and mobility in order to decrease pain. PT will recert pt per new POC and continue to progress as able.    Personal Factors and Comorbidities Comorbidity 1;Fitness    Comorbidities HTN    Examination-Activity Limitations Caring for Others;Carry;Sleep;Lift    Examination-Participation Restrictions Laundry;Occupation;Cleaning    PT Frequency 1x / week    PT Duration 6 weeks    PT Treatment/Interventions ADLs/Self Care Home Management;Biofeedback;Cryotherapy;Electrical Stimulation;Traction;Moist Heat;Therapeutic activities;Therapeutic exercise;Neuromuscular re-education;Manual techniques;Joint Manipulations;Spinal Manipulations;Dry needling;Passive range of motion;Patient/family education;Taping    PT Next Visit Plan progress DNF/ scapular strengthening/progress thoracic mobility    PT Home Exercise Plan [id number]    Consulted and Agree with Plan of Care Patient             Patient will benefit from skilled therapeutic intervention in order to improve the following deficits and impairments:  Impaired UE functional use, Obesity, Decreased endurance, Pain, Impaired perceived functional ability, Hypomobility, Impaired flexibility, Postural dysfunction, Impaired sensation, Decreased strength  Visit Diagnosis: Cervicalgia - Plan: PT plan of care cert/re-cert  Chronic right shoulder pain - Plan: PT plan of care cert/re-cert  Muscle weakness (generalized) - Plan: PT plan of care cert/re-cert  Other disturbances of skin sensation - Plan: PT plan of care cert/re-cert     Problem List Patient Active Problem List   Diagnosis Date Noted   Chronic right  shoulder pain 01/04/2021   Numbness and tingling in right hand 01/04/2021   Essential hypertension 01/04/2021    Ward Chatters, PT 03/23/2021, 5:23 PM  [facility] [address] [place], [place], [address] Phone: [phone]   Fax:  [phone]  Name: Jill Stevenson MRN: [id number] Date of Birth: February 01, 1981

## 2021-03-29 ENCOUNTER — Ambulatory Visit: Payer: Medicaid Other | Attending: Family Medicine

## 2021-03-29 ENCOUNTER — Telehealth: Payer: Self-pay

## 2021-03-29 DIAGNOSIS — R208 Other disturbances of skin sensation: Secondary | ICD-10-CM | POA: Insufficient documentation

## 2021-03-29 DIAGNOSIS — M542 Cervicalgia: Secondary | ICD-10-CM | POA: Insufficient documentation

## 2021-03-29 DIAGNOSIS — M6281 Muscle weakness (generalized): Secondary | ICD-10-CM | POA: Insufficient documentation

## 2021-03-29 DIAGNOSIS — M25511 Pain in right shoulder: Secondary | ICD-10-CM | POA: Insufficient documentation

## 2021-03-29 DIAGNOSIS — G8929 Other chronic pain: Secondary | ICD-10-CM | POA: Insufficient documentation

## 2021-03-29 NOTE — Telephone Encounter (Signed)
PT called and patient is currently at urgent care. We reviewed attendance policy and reminded her of next appointment.   Ward Chatters, PT, DPT 03/29/21 11:10 AM

## 2021-04-05 ENCOUNTER — Ambulatory Visit: Payer: Medicaid Other

## 2021-04-05 ENCOUNTER — Other Ambulatory Visit: Payer: Self-pay

## 2021-04-05 DIAGNOSIS — G8929 Other chronic pain: Secondary | ICD-10-CM

## 2021-04-05 DIAGNOSIS — M542 Cervicalgia: Secondary | ICD-10-CM | POA: Diagnosis present

## 2021-04-05 DIAGNOSIS — M6281 Muscle weakness (generalized): Secondary | ICD-10-CM | POA: Diagnosis present

## 2021-04-05 DIAGNOSIS — R208 Other disturbances of skin sensation: Secondary | ICD-10-CM | POA: Diagnosis present

## 2021-04-05 DIAGNOSIS — M25511 Pain in right shoulder: Secondary | ICD-10-CM | POA: Diagnosis present

## 2021-04-05 NOTE — Therapy (Addendum)
[place], [place], [address] Phone: [phone]   Fax:  [phone]  Physical Therapy Treatment/Discharge  Patient Details  Name: Jill Stevenson MRN: [id number] Date of Birth: 06/18/1980 Referring Provider (PT): Juluis Mire P   Encounter Date: 04/05/2021   PT End of Session - 04/05/21 1830     Visit Number 7    Number of Visits 12    Date for PT Re-Evaluation 05/04/21    Authorization Type UHC MCD    Authorization - Visit Number 7    Authorization - Number of Visits 27    PT Start Time 7893    PT Stop Time 1908    PT Time Calculation (min) 38 min    Activity Tolerance Patient tolerated treatment well    Behavior During Therapy [facility] for tasks assessed/performed             Past Medical History:  Diagnosis Date   Hypertension    Medical history non-contributory     Past Surgical History:  Procedure Laterality Date   CESAREAN SECTION      There were no vitals filed for this visit.   Subjective Assessment - 04/05/21 1830     Subjective Pt presents to PT with reports of increased R shoulder pain and reports of "burning" in R proximal UE. Has been fairly compliant with HEP with no adverse effect. Is ready to begin PT at this time.    Currently in Pain? Yes    Pain Score 7     Pain Location Shoulder    Pain Orientation Right    Pain Descriptors / Indicators Burning           [facility] Adult PT Treatment/Exercise:   Therapeutic Exercise:  UBE lvl 1.5 x 4 min while taking subjective R UE median nerve glide at wall x 10 Rows 2x12 20lbs Shoulder ext 2x10 20lbs   Seated thoracic ext over soft foam 2x15 Supine horizontal abd 2x15 green tband Cat Cow 2x10 S/L open book 2x10 ea Thoracic ext stretch w/ soft foam on wall 2x10 Total gym row 2x10 35lbs  Manual Therapy: Grade V prone thoracic manipulation Grade III side glides bilat  Manual cervical traction  Interventions Not  Performed Today: Cervical ext SNAG 2x10 - 5 sec hold Cervical rot SNAG x 5 ea Supine chin tuck x 10 - 5 sec hold R Shoulder IR/ER isometric 2x10 - 5 sec                               PT Short Term Goals - 03/23/21 1514       PT SHORT TERM GOAL #1   Title Pt will report understanding and adherence of her HEP in order to promote independence in the management of her primary impairments.    Baseline HEP given at eval    Time 4    Period Weeks    Status Achieved    Target Date 02/15/21      PT SHORT TERM GOAL #2   Title -    Baseline -      PT SHORT TERM GOAL #3   Title -    Baseline -               PT Long Term Goals - 03/23/21 1516       PT LONG TERM GOAL #1   Title Pt will demonstrate 4+/5 strength in BIL  middle trap, middle trap, and lat MMT in order to promote good scapular/ cervical posture.    Baseline 3/5 in BIL middle trap, lower trap, and lats;  4/5 on 03/23/21    Time 8    Period Weeks    Status Partially Met    Target Date 05/04/21      PT LONG TERM GOAL #2   Title Pt will achieve supine DNF endurance test of 45 seconds or higher in order to promote WNL cervical posture and offer prophylaxis for future cervical pain.    Baseline 18 seconds; 20 seconds on 03/23/21    Time 8    Period Weeks    Status On-going    Target Date 05/04/21      PT LONG TERM GOAL #3   Title Pt will report average pain of 0-4/10 in order to be less limited when performing ADL's.    Baseline 8/10 baseline pain    Time 8    Period Weeks    Status On-going    Target Date 05/04/21                   Plan - 04/05/21 1848     Clinical Impression Statement Pt was again able to complete prescribed exercises with no adverse effect. Today we focused on improving thoracic mobility, decreasing neural tension, and manual for decreasing pain. She responded very well to manual therapy interventions, noting 0/10 pain at end of session. Pt continues to  progress as expected with therapy and should keep progressing with improved HEP compliance.    PT Treatment/Interventions ADLs/Self Care Home Management;Biofeedback;Cryotherapy;Electrical Stimulation;Traction;Moist Heat;Therapeutic activities;Therapeutic exercise;Neuromuscular re-education;Manual techniques;Joint Manipulations;Spinal Manipulations;Dry needling;Passive range of motion;Patient/family education;Taping    PT Next Visit Plan progress DNF/ scapular strengthening/progress thoracic mobility    PT Home Exercise Plan Access Code: [email or web address]             Patient will benefit from skilled therapeutic intervention in order to improve the following deficits and impairments:  Impaired UE functional use, Obesity, Decreased endurance, Pain, Impaired perceived functional ability, Hypomobility, Impaired flexibility, Postural dysfunction, Impaired sensation, Decreased strength  Visit Diagnosis: Chronic right shoulder pain  Cervicalgia  Muscle weakness (generalized)  Other disturbances of skin sensation     Problem List Patient Active Problem List   Diagnosis Date Noted   Chronic right shoulder pain 01/04/2021   Numbness and tingling in right hand 01/04/2021   Essential hypertension 01/04/2021    Ward Chatters, PT 04/05/2021, 7:13 PM  [facility] [address] [place], [place], [address] Phone: [phone]   Fax:  [phone]  Name: Jill Stevenson MRN: [id number] Date of Birth: [birth year]   PHYSICAL THERAPY DISCHARGE SUMMARY  Visits from Start of Care: 7  Current functional level related to goals / functional outcomes: NA   Remaining deficits: NA   Education / Equipment: NA   Patient agrees to discharge. Patient goals were partially met. Patient is being discharged due to not returning since the last visit.

## 2021-04-12 ENCOUNTER — Ambulatory Visit: Payer: Medicaid Other

## 2021-04-13 ENCOUNTER — Encounter: Payer: Medicaid Other | Attending: Primary Care | Admitting: Dietician

## 2021-04-25 ENCOUNTER — Other Ambulatory Visit: Payer: Self-pay | Admitting: Primary Care

## 2021-04-25 DIAGNOSIS — Z1231 Encounter for screening mammogram for malignant neoplasm of breast: Secondary | ICD-10-CM

## 2021-04-26 ENCOUNTER — Ambulatory Visit: Payer: Medicaid Other

## 2021-05-11 ENCOUNTER — Other Ambulatory Visit (INDEPENDENT_AMBULATORY_CARE_PROVIDER_SITE_OTHER): Payer: Self-pay | Admitting: Primary Care

## 2021-05-11 DIAGNOSIS — I10 Essential (primary) hypertension: Secondary | ICD-10-CM

## 2021-05-26 ENCOUNTER — Inpatient Hospital Stay: Admission: RE | Admit: 2021-05-26 | Payer: Self-pay | Source: Ambulatory Visit

## 2021-11-06 ENCOUNTER — Ambulatory Visit (INDEPENDENT_AMBULATORY_CARE_PROVIDER_SITE_OTHER): Payer: Medicaid Other

## 2021-11-06 ENCOUNTER — Ambulatory Visit (HOSPITAL_COMMUNITY)
Admission: EM | Admit: 2021-11-06 | Discharge: 2021-11-06 | Disposition: A | Discharge status: Home / Self Care | Payer: Medicaid Other | Attending: Emergency Medicine | Admitting: Emergency Medicine

## 2021-11-06 ENCOUNTER — Encounter (HOSPITAL_COMMUNITY): Payer: Self-pay

## 2021-11-06 DIAGNOSIS — S92424A Nondisplaced fracture of distal phalanx of right great toe, initial encounter for closed fracture: Secondary | ICD-10-CM | POA: Diagnosis not present

## 2021-11-06 DIAGNOSIS — M7989 Other specified soft tissue disorders: Secondary | ICD-10-CM | POA: Diagnosis not present

## 2021-11-06 MED ORDER — TRAMADOL HCL 50 MG PO TABS: 50.0000 mg | ORAL_TABLET | Freq: Four times a day (QID) | ORAL | 0 refills | Status: DC | PRN

## 2021-11-06 NOTE — ED Triage Notes (Signed)
Pt states that she tripped over a shoe and right great toe. Rt great toe is swollen and discolored.

## 2021-11-06 NOTE — Discharge Instructions (Addendum)
Your x-ray today showed a fracture ( break in bone) of right great toe  first and second toe has been taped together to add stability and support and you have been given a postop shoe in addition.  This is used to protect your injury and prevent further damage. Use until seen by orthopedic specialist.   Continue to attempt use of Tylenol and ibuprofen for management of discomfort, for severe pain you may use tramadol every 6 hours as needed, be mindful this medication may make you drowsy  Follow up with orthopedic specialist in 1 week. Call practice to make appointment. Information listed below. You may go to any orthopedic provider you deem fit.  Please do not put weight on fracture.  Attempt to walk on heel

## 2021-11-06 NOTE — ED Provider Notes (Signed)
[place]    CSN: [id number] Arrival date & time: 11/06/21  1208      History   Chief Complaint Chief Complaint  Patient presents with   Toe Injury    HPI Jill Stevenson is a 41 y.o. female.   Patient presents with pain, swelling and bruising to the right great toe beginning 1 day ago.  Endorses that she was walking when she tripped over her shoes stubbing her toe.  Painful to bear weight.  Range of motion intact but elicits pain.  Pain is described as a throbbing sensation that began to interfere with sleep overnight.  Has attempted use of Tylenol and ibuprofen which has been minimally effective. Past Medical History:  Diagnosis Date   Hypertension    Medical history non-contributory     Patient Active Problem List   Diagnosis Date Noted   Chronic right shoulder pain 01/04/2021   Numbness and tingling in right hand 01/04/2021   Essential hypertension 01/04/2021    Past Surgical History:  Procedure Laterality Date   CESAREAN SECTION      OB History     Gravida  3   Para  2   Term  2   Preterm      AB      Living  2      SAB      IAB      Ectopic      Multiple      Live Births               Home Medications    Prior to Admission medications   Medication Sig Start Date End Date Taking? Authorizing Provider  hydrochlorothiazide (HYDRODIURIL) 25 MG tablet Take 1 tablet (25 mg total) by mouth daily. 02/11/20  Yes Kerin Perna, NP  acetaminophen (TYLENOL) 325 MG tablet Take 650 mg by mouth every 6 (six) hours as needed for mild pain, fever or headache.    [provider]  amLODipine (NORVASC) 5 MG tablet TAKE 1 TABLET(5 MG) BY MOUTH DAILY 05/11/21   Kerin Perna, NP  calcium-vitamin D (OSCAL WITH D) 500-200 MG-UNIT tablet Take 1 tablet by mouth daily with breakfast for 7 days. 09/08/20 09/15/20  Horton, Alvin Critchley, DO  etonogestrel-ethinyl estradiol (NUVARING) 0.12-0.015 MG/24HR vaginal ring Insert vaginally and  leave in place for 3 consecutive weeks, then remove for 1 week. 01/02/19   Woodroe Mode, MD  tiZANidine (ZANAFLEX) 4 MG tablet Take 1 tablet (4 mg total) by mouth every 6 (six) hours as needed for muscle spasms. 01/04/21   Fenton Foy, NP    Family History Family History  Problem Relation Age of Onset   Breast cancer Maternal Aunt     Social History Social History   Tobacco Use   Smoking status: Former    Packs/day: 0.25    Types: Cigarettes    Quit date: 07/27/2021    Years since quitting: 0.2   Smokeless tobacco: Never  Vaping Use   Vaping Use: Every day  Substance Use Topics   Alcohol use: Yes    Comment: occasionally   Drug use: No     Allergies   Patient has no known allergies.   Review of Systems Review of Systems Defer to HPI   Physical Exam Triage Vital Signs ED Triage Vitals  Enc Vitals Group     BP 11/06/21 1252 (!) 160/110     Pulse Rate 11/06/21 1252 86  Resp 11/06/21 1252 20     Temp 11/06/21 1252 97.8 F (36.6 C)     Temp Source 11/06/21 1252 Oral     SpO2 11/06/21 1252 99 %     Weight --      Height --      Head Circumference --      Peak Flow --      Pain Score 11/06/21 1255 10     Pain Loc --      Pain Edu? --      Excl. in [place]? --    No data found.  Updated Vital Signs BP (!) 160/110 (BP Location: Left Arm)   Pulse 86   Temp 97.8 F (36.6 C) (Oral)   Resp 20   LMP 10/16/2021   SpO2 99%   Visual Acuity Right Eye Distance:   Left Eye Distance:   Bilateral Distance:    Right Eye Near:   Left Eye Near:    Bilateral Near:     Physical Exam Constitutional:      Appearance: Normal appearance.  HENT:     Head: Normocephalic.  Eyes:     Extraocular Movements: Extraocular movements intact.  Pulmonary:     Effort: Pulmonary effort is normal.  Feet:     Comments: Moderate ecchymosis noted to the dorsal aspect of the right great toe, moderate to severe swelling with tenderness noted over the proximal phalanx, range of  motion intact, sensation intact, capillary refill less than 3, able to bear weight but elicits pain Neurological:     Mental Status: She is alert and oriented to person, place, and time. Mental status is at baseline.  Psychiatric:        Mood and Affect: Mood normal.        Behavior: Behavior normal.      UC Treatments / Results  Labs (all labs ordered are listed, but only abnormal results are displayed) Labs Reviewed - No data to display  EKG   Radiology DG Toe Great Right  Result Date: 11/06/2021 CLINICAL DATA:  Injury EXAM: RIGHT GREAT TOE COMPARISON:  None Available. FINDINGS: There is a nondisplaced fracture at the base of the great toe distal phalanx which appears to be extra-articular. Adjacent soft tissue swelling. Mild first MTP osteoarthritis. IMPRESSION: Nondisplaced extra-articular fracture at the base of the great toe distal phalanx. Electronically Signed   By: Maurine Simmering M.D.   On: 11/06/2021 13:42    Procedures Procedures (including critical care time)  Medications Ordered in UC Medications - No data to display  Initial Impression / Assessment and Plan / UC Course  I have reviewed the triage vital signs and the nursing notes.  Pertinent labs & imaging results that were available during my care of the patient were reviewed by me and considered in my medical decision making (see chart for details).  Closed nondisplaced fracture of the distal phalanx of the right great toe, initial encounter  Confirmed via x-ray, discussed findings with patient, the great toe and second toe by headache and postop shoe placed, advised heel walking and to remain nonweightbearing, patient to follow-up with her orthopedic specialist in 1 week for further evaluation and management, as Tylenol and ibuprofen has been ineffective with pain management, tramadol prescribed, PDMP reviewed, low risk Final Clinical Impressions(s) / UC Diagnoses   Final diagnoses:  Closed nondisplaced fracture  of distal phalanx of right great toe, initial encounter     Discharge Instructions      Your x-ray  today showed a fracture ( break in bone) of right great toe   Her first and second toe has been taped together to add stability and support and you have been given a postop shoe in addition.  This is used to protect your injury and prevent further damage. Use until seen by orthopedic specialist.   Follow up with orthopedic specialist in 1 week. Call practice to make appointment. Information listed below. You may go to any orthopedic provider you deem fit.  Please do not put weight on fracture.  Attempt to walk on heels.     ED Prescriptions   None    PDMP not reviewed this encounter.   Hans Eden, NP 11/06/21 1438

## 2021-11-10 ENCOUNTER — Ambulatory Visit (HOSPITAL_BASED_OUTPATIENT_CLINIC_OR_DEPARTMENT_OTHER): Payer: Medicaid Other | Admitting: Orthopaedic Surgery

## 2021-11-11 ENCOUNTER — Ambulatory Visit (INDEPENDENT_AMBULATORY_CARE_PROVIDER_SITE_OTHER): Payer: Medicaid Other | Admitting: Orthopaedic Surgery

## 2021-11-11 ENCOUNTER — Ambulatory Visit (INDEPENDENT_AMBULATORY_CARE_PROVIDER_SITE_OTHER): Payer: Self-pay

## 2021-11-11 DIAGNOSIS — S92421A Displaced fracture of distal phalanx of right great toe, initial encounter for closed fracture: Secondary | ICD-10-CM | POA: Diagnosis not present

## 2021-11-11 NOTE — Progress Notes (Signed)
Chief Complaint: Right first toe fracture     History of Present Illness:    Jill Stevenson is a 41 y.o. female presents today with a first toe distal phalanx fracture on the right after she tripped over a large shoe.  Since that time she went to the emergency room was placed in a hard soled shoe.  She was told to keep weight off of it.  She is currently taking tramadol she does not help with the pain.  She is unable to sleep as result of it.  She is having a hard time difficult to cannulate.  Here today for further assessment    Surgical History:   None  PMH/PSH/Family History/Social History/Meds/Allergies:    Past Medical History:  Diagnosis Date   Hypertension    Medical history non-contributory    Past Surgical History:  Procedure Laterality Date   CESAREAN SECTION     Social History   Socioeconomic History   Marital status: Single    Spouse name: Not on file   Number of children: Not on file   Years of education: Not on file   Highest education level: Not on file  Occupational History   Not on file  Tobacco Use   Smoking status: Former    Packs/day: 0.25    Types: Cigarettes    Quit date: 07/27/2021    Years since quitting: 0.2   Smokeless tobacco: Never  Vaping Use   Vaping Use: Every day  Substance and Sexual Activity   Alcohol use: Yes    Comment: occasionally   Drug use: No   Sexual activity: Yes    Birth control/protection: Inserts  Other Topics Concern   Not on file  Social History Narrative   Not on file   Social Determinants of Health   Financial Resource Strain: Not on file  Food Insecurity: Not on file  Transportation Needs: Not on file  Physical Activity: Not on file  Stress: Not on file  Social Connections: Not on file   Family History  Problem Relation Age of Onset   Breast cancer Maternal Aunt    No Known Allergies Current Outpatient Medications  Medication Sig Dispense Refill    etonogestrel-ethinyl estradiol (NUVARING) 0.12-0.015 MG/24HR vaginal ring Insert vaginally and leave in place for 3 consecutive weeks, then remove for 1 week. 1 each 12   hydrochlorothiazide (HYDRODIURIL) 25 MG tablet Take 1 tablet (25 mg total) by mouth daily. 90 tablet 1   traMADol (ULTRAM) 50 MG tablet Take 1 tablet (50 mg total) by mouth every 6 (six) hours as needed. 15 tablet 0   No current facility-administered medications for this visit.   No results found.  Review of Systems:   A ROS was performed including pertinent positives and negatives as documented in the HPI.  Physical Exam :   Constitutional: NAD and appears stated age Neurological: Alert and oriented Psych: Appropriate affect and cooperative Last menstrual period 10/16/2021.   Comprehensive Musculoskeletal Exam:    Tenderness elevation about the distal phalanx of the right great toe.  She walks with an antalgic gait.  She does have bilateral lower extremity edema which is chronic and baseline for her.  Otherwise warm and well-perfused toes  Imaging:   Xray (3 views right foot): Nondisplaced distal phalanx fracture of the right  great toe   I personally reviewed and interpreted the radiographs.   Assessment:   41 y.o. female with a right distal phalanx fracture of the great toe.  Overall I do believe this would do well with conservative management given the fracture is nondisplaced.  This time she may continue to weight-bear as tolerated.  I will plan to see her back in 1 month for final x-ray.  She was also advised a carbon fiber insole as well to help when she transitions to a normal shoe  Plan :    -Return to clinic 1 month for final check     I personally saw and evaluated the patient, and participated in the management and treatment plan.  Vanetta Mulders, MD Attending Physician, Orthopedic Surgery  This document was dictated using Dragon voice recognition software. A reasonable attempt at proof reading  has been made to minimize errors.

## 2021-11-11 NOTE — Telephone Encounter (Signed)
  Chief Complaint: right calf swelling.  Symptoms: reddened swelling that is "tight", pain in right calf with walking, slightly sore  and war to touch Frequency: 2 weeks Pertinent Negatives: Patient denies SOB, chest pain, fever, swelling to left calf only to left foot and slightly above ankle Disposition: '[x]'$ ED /'[]'$ Urgent Care (no appt availability in office) / '[]'$ Appointment(In office/virtual)/ '[]'$  [place] [facility]/ '[]'$ Home Care/ '[]'$ Refused Recommended Disposition /'[]'$ [place] [facility]/ '[]'$  Follow-up with PCP Additional Notes: Pt advised to go to ED to R/O DVT.   Reason for Disposition  [1] Thigh or calf pain AND [2] only 1 side AND [3] present > 1 hour  Answer Assessment - Initial Assessment Questions 1. ONSET: "When did the swelling start?" (e.g., minutes, hours, days)     2 weeks 2. LOCATION: "What part of the leg is swollen?"  "Are both legs swollen or just one leg?"     Knees down right leg is more swollen --- left foot to ankle 3. SEVERITY: "How bad is the swelling?" (e.g., localized; mild, moderate, severe)  - Localized - small area of swelling localized to one leg  - MILD pedal edema - swelling limited to foot and ankle, pitting edema < 1/4 inch (6 mm) deep, rest and elevation eliminate most or all swelling  - MODERATE edema - swelling of lower leg to knee, pitting edema > 1/4 inch (6 mm) deep, rest and elevation only partially reduce swelling  - SEVERE edema - swelling extends above knee, facial or hand swelling present      Left mild   right lower leg moderately swollen 4. REDNESS: "Does the swelling look red or infected?"     Yes red and hives and rash and shiny right leg 5. PAIN: "Is the swelling painful to touch?" If Yes, ask: "How painful is it?"   (Scale 1-10; mild, moderate or severe)     Mild but irritating 6. FEVER: "Do you have a fever?" If Yes, ask: "What is it, how was it measured, and when did it start?"      no 7. CAUSE: "What do you think is causing the leg  swelling?"     Blood sugar 8. MEDICAL HISTORY: "Do you have a history of heart failure, kidney disease, liver failure, or cancer?"     *No Answer* 9. RECURRENT SYMPTOM: "Have you had leg swelling before?" If Yes, ask: "When was the last time?" "What happened that time?"     *No Answer* 10. OTHER SYMPTOMS: "Do you have any other symptoms?" (e.g., chest pain, difficulty breathing)       Warm to touch painful to calf area  11. PREGNANCY: "Is there any chance you are pregnant?" "When was your last menstrual period?"       *No Answer*  Protocols used: Leg Swelling and Edema-A-AH

## 2021-11-12 ENCOUNTER — Emergency Department (HOSPITAL_BASED_OUTPATIENT_CLINIC_OR_DEPARTMENT_OTHER): Payer: Medicaid Other

## 2021-11-12 ENCOUNTER — Emergency Department (HOSPITAL_COMMUNITY): Payer: Medicaid Other

## 2021-11-12 ENCOUNTER — Other Ambulatory Visit: Payer: Self-pay

## 2021-11-12 ENCOUNTER — Encounter (HOSPITAL_COMMUNITY): Payer: Self-pay

## 2021-11-12 ENCOUNTER — Emergency Department (HOSPITAL_COMMUNITY)
Admission: EM | Admit: 2021-11-12 | Discharge: 2021-11-12 | Disposition: A | Discharge status: Home / Self Care | Payer: Medicaid Other | Attending: Emergency Medicine | Admitting: Emergency Medicine

## 2021-11-12 DIAGNOSIS — M79604 Pain in right leg: Secondary | ICD-10-CM | POA: Diagnosis not present

## 2021-11-12 DIAGNOSIS — R079 Chest pain, unspecified: Secondary | ICD-10-CM | POA: Diagnosis not present

## 2021-11-12 DIAGNOSIS — I10 Essential (primary) hypertension: Secondary | ICD-10-CM | POA: Insufficient documentation

## 2021-11-12 DIAGNOSIS — Z79899 Other long term (current) drug therapy: Secondary | ICD-10-CM | POA: Insufficient documentation

## 2021-11-12 DIAGNOSIS — L03115 Cellulitis of right lower limb: Secondary | ICD-10-CM | POA: Insufficient documentation

## 2021-11-12 DIAGNOSIS — R600 Localized edema: Secondary | ICD-10-CM | POA: Diagnosis not present

## 2021-11-12 DIAGNOSIS — R791 Abnormal coagulation profile: Secondary | ICD-10-CM | POA: Insufficient documentation

## 2021-11-12 DIAGNOSIS — F17200 Nicotine dependence, unspecified, uncomplicated: Secondary | ICD-10-CM | POA: Diagnosis not present

## 2021-11-12 DIAGNOSIS — M7989 Other specified soft tissue disorders: Secondary | ICD-10-CM | POA: Diagnosis not present

## 2021-11-12 DIAGNOSIS — R0602 Shortness of breath: Secondary | ICD-10-CM | POA: Diagnosis not present

## 2021-11-12 LAB — HCG, QUANTITATIVE, PREGNANCY: hCG, Beta Chain, Quant, S: <1 m[IU]/mL (ref <5)

## 2021-11-12 LAB — CBC
HCT: 37.7 % (ref 36.0–46.0)
Hemoglobin: 12.4 g/dL (ref 12.0–15.0)
MCH: 29.2 pg (ref 26.0–34.0)
MCHC: 32.9 g/dL (ref 30.0–36.0)
MCV: 88.9 fL (ref 80.0–100.0)
Platelets: 189 10*3/uL (ref 150–400)
RBC: 4.24 MIL/uL (ref 3.87–5.11)
RDW: 14.2 % (ref 11.5–15.5)
WBC: 8.4 10*3/uL (ref 4.0–10.5)
nRBC: 0 % (ref 0.0–0.2)

## 2021-11-12 LAB — BASIC METABOLIC PANEL
Anion gap: 12 (ref 5–15)
BUN: 11 mg/dL (ref 6–20)
CO2: 25 mmol/L (ref 22–32)
Calcium: 8.6 mg/dL — ABNORMAL LOW (ref 8.9–10.3)
Chloride: 100 mmol/L (ref 98–111)
Creatinine, Ser: 1.11 mg/dL — ABNORMAL HIGH (ref 0.44–1.00)
GFR, Estimated: >60 mL/min (ref >60)
Glucose, Bld: 122 mg/dL — ABNORMAL HIGH (ref 70–99)
Potassium: 3.2 mmol/L — ABNORMAL LOW (ref 3.5–5.1)
Sodium: 137 mmol/L (ref 135–145)

## 2021-11-12 LAB — BRAIN NATRIURETIC PEPTIDE: B Natriuretic Peptide: 23.7 pg/mL (ref 0.0–100.0)

## 2021-11-12 LAB — D-DIMER, QUANTITATIVE: D-Dimer, Quant: 1.13 ug/mL-FEU — ABNORMAL HIGH (ref 0.00–0.50)

## 2021-11-12 MED ORDER — SULFAMETHOXAZOLE-TRIMETHOPRIM 800-160 MG PO TABS: 1.0000 | ORAL_TABLET | Freq: Two times a day (BID) | ORAL | 0 refills | Status: AC

## 2021-11-12 MED ORDER — IOHEXOL 350 MG/ML SOLN
100.0000 mL | Freq: Once | INTRAVENOUS | Status: AC | PRN
  Administered 2021-11-12: 100 mL via INTRAVENOUS

## 2021-11-12 MED ORDER — SODIUM CHLORIDE 0.9 % IV BOLUS
500.0000 mL | Freq: Once | INTRAVENOUS | Status: AC
  Administered 2021-11-12: 500 mL via INTRAVENOUS

## 2021-11-12 NOTE — Progress Notes (Signed)
Right lower extremity venous duplex has been completed. Preliminary results can be found in CV Proc through chart review.  Results were given to [facility] PA.  11/12/21 10:01 AM Carlos Levering RVT

## 2021-11-12 NOTE — ED Notes (Signed)
Patient transported to X-ray 

## 2021-11-12 NOTE — Discharge Instructions (Addendum)
Your laboratory results are within normal limits today.  You were given a prescription for antibiotics in order to help treat your likely infection of the right leg.  Take 1 tablet twice a day for the next 7 days.  Follow-up with your primary care physician as needed.

## 2021-11-12 NOTE — ED Provider Notes (Signed)
[facility] EMERGENCY DEPARTMENT Provider Note   CSN: [id number] Arrival date & time: 11/12/21  7829     History  Chief Complaint  Patient presents with   Leg Swelling    Jill RAMASWAMY is a 41 y.o. female.  41 year old female with a past medical history of high blood pressure presents to the ED with a chief complaint of bilateral leg swelling for the past 2 weeks.  Patient did break her right great toe approximately a month ago, reports he has been walking with the boot.  Also endorses a "weird tingling sensation "to the right leg, specifically along the posterior aspect.  She is feeling more short of breath with any type of activity.  She does endorse tobacco use, smoking 1 pack every 4 days.  Does report the swelling is improved after she elevates it at night.  However, there are days where she wakes up and both of her legs are equally swollen.  She is currently employed as a [employer] certified accountant, states that she was told to be seen in the emergency department to rule out DVT.  Currently on oral contraceptives, no prior history blood clots, no chest pain.  The history is provided by the patient and medical records.       Home Medications Prior to Admission medications   Medication Sig Start Date End Date Taking? Authorizing Provider  amLODipine (NORVASC) 10 MG tablet Take 10 mg by mouth daily.   Yes [provider]  etonogestrel-ethinyl estradiol (NUVARING) 0.12-0.015 MG/24HR vaginal ring Insert vaginally and leave in place for 3 consecutive weeks, then remove for 1 week. 01/02/19  Yes Woodroe Mode, MD  traMADol (ULTRAM) 50 MG tablet Take 1 tablet (50 mg total) by mouth every 6 (six) hours as needed. 11/06/21  Yes White, Adrienne R, NP  sulfamethoxazole-trimethoprim (BACTRIM DS) 800-160 MG tablet Take 1 tablet by mouth 2 (two) times daily for 7 days. 11/12/21 11/19/21 Yes Janeece Fitting, PA-C      Allergies    Patient has no known allergies.    Review of Systems    Review of Systems  Constitutional:  Negative for fever.  Respiratory:  Positive for shortness of breath. Negative for cough.   Cardiovascular:  Positive for leg swelling. Negative for chest pain.  Gastrointestinal:  Negative for abdominal pain, nausea and vomiting.  Genitourinary:  Negative for flank pain.  Neurological:  Negative for light-headedness and headaches.  All other systems reviewed and are negative.   Physical Exam Updated Vital Signs BP (!) 117/96   Pulse (!) 102   Temp 98.5 F (36.9 C) (Oral)   Resp (!) 21   Ht 6' (1.829 m)   Wt (!) 172.4 kg   LMP 10/16/2021 (Approximate)   SpO2 98%   BMI 51.54 kg/m  Physical Exam Vitals and nursing note reviewed.  Constitutional:      General: She is not in acute distress.    Appearance: She is well-developed.  HENT:     Head: Normocephalic and atraumatic.     Mouth/Throat:     Pharynx: No oropharyngeal exudate.  Eyes:     Pupils: Pupils are equal, round, and reactive to light.  Cardiovascular:     Rate and Rhythm: Regular rhythm.     Heart sounds: Normal heart sounds.  Pulmonary:     Effort: Pulmonary effort is normal. No respiratory distress.     Comments: Lungs are difficult to auscultate due to body habitus. Abdominal:  General: Bowel sounds are normal. There is no distension.     Palpations: Abdomen is soft.     Tenderness: There is no abdominal tenderness.  Musculoskeletal:        General: No deformity.     Cervical back: Normal range of motion.     Right lower leg: Tenderness present. 1+ Pitting Edema present.     Left lower leg: 1+ Pitting Edema present.       Legs:  Skin:    General: Skin is warm and dry.  Neurological:     Mental Status: She is alert and oriented to person, place, and time.        ED Results / Procedures / Treatments   Labs (all labs ordered are listed, but only abnormal results are displayed) Labs Reviewed  BASIC METABOLIC PANEL - Abnormal; Notable for the following  components:      Result Value   Potassium 3.2 (*)    Glucose, Bld 122 (*)    Creatinine, Ser 1.11 (*)    Calcium 8.6 (*)    All other components within normal limits  D-DIMER, QUANTITATIVE - Abnormal; Notable for the following components:   D-Dimer, Quant 1.13 (*)    All other components within normal limits  CBC  BRAIN NATRIURETIC PEPTIDE  HCG, QUANTITATIVE, PREGNANCY    EKG EKG Interpretation  Date/Time:  Saturday November 12 2021 08:45:00 EDT Ventricular Rate:  84 PR Interval:  162 QRS Duration: 106 QT Interval:  384 QTC Calculation: 454 R Axis:   75 Text Interpretation: Sinus rhythm RAE, consider biatrial enlargement Confirmed by Thamas Jaegers ([id number]) on 11/12/2021 12:49:23 PM  Radiology VAS [place] LOWER EXTREMITY VENOUS (DVT)  Result Date: 11/12/2021  Lower Venous DVT Study Patient Name:  CHELSI WARR  Date of Exam:   11/12/2021 Medical Rec #: [id number]       Accession #:    [id number] Date of Birth: 1981/02/22       Patient Gender: F Patient Age:   6 years Exam Location:  [facility] Procedure:      VAS [place] LOWER EXTREMITY VENOUS (DVT) Referring Phys: Beverley Fiedler Meril Dray --------------------------------------------------------------------------------  Indications: Pain.  Risk Factors: None identified. Limitations: Body habitus and poor ultrasound/tissue interface. Comparison Study: No prior studies. Performing Technologist: Oliver Hum RVT  Examination Guidelines: A complete evaluation includes B-mode imaging, spectral Doppler, color Doppler, and power Doppler as needed of all accessible portions of each vessel. Bilateral testing is considered an integral part of a complete examination. Limited examinations for reoccurring indications may be performed as noted. The reflux portion of the exam is performed with the patient in reverse Trendelenburg.  +---------+---------------+---------+-----------+----------+-------------------+ RIGHT     CompressibilityPhasicitySpontaneityPropertiesThrombus Aging      +---------+---------------+---------+-----------+----------+-------------------+ CFV      Full           Yes      Yes                                      +---------+---------------+---------+-----------+----------+-------------------+ SFJ      Full                                                             +---------+---------------+---------+-----------+----------+-------------------+ FV  Prox  Full                                                             +---------+---------------+---------+-----------+----------+-------------------+ FV Mid   Full                                                             +---------+---------------+---------+-----------+----------+-------------------+ FV Distal               Yes      Yes                                      +---------+---------------+---------+-----------+----------+-------------------+ PFV      Full                                                             +---------+---------------+---------+-----------+----------+-------------------+ POP      Full           Yes      Yes                                      +---------+---------------+---------+-----------+----------+-------------------+ PTV      Full                                                             +---------+---------------+---------+-----------+----------+-------------------+ PERO                                                  Not well visualized +---------+---------------+---------+-----------+----------+-------------------+   +----+---------------+---------+-----------+----------+--------------+ LEFTCompressibilityPhasicitySpontaneityPropertiesThrombus Aging +----+---------------+---------+-----------+----------+--------------+ CFV Full           Yes      Yes                                  +----+---------------+---------+-----------+----------+--------------+    Summary: RIGHT: - There is no evidence of deep vein thrombosis in the lower extremity. However, portions of this examination were limited- see technologist comments above.  - No cystic structure found in the popliteal fossa.  LEFT: - No evidence of common femoral vein obstruction.  *See table(s) above for measurements and observations. Electronically signed by Jamelle Haring on 11/12/2021 at 12:22:11 PM.    Final    CT Angio Chest PE W and/or Wo Contrast  Result Date: 11/12/2021 CLINICAL DATA:  Chest pain and shortness of breath. EXAM: CT ANGIOGRAPHY  CHEST WITH CONTRAST TECHNIQUE: Multidetector CT imaging of the chest was performed using the standard protocol during bolus administration of intravenous contrast. Multiplanar CT image reconstructions and MIPs were obtained to evaluate the vascular anatomy. RADIATION DOSE REDUCTION: This exam was performed according to the departmental dose-optimization program which includes automated exposure control, adjustment of the mA and/or kV according to patient size and/or use of iterative reconstruction technique. CONTRAST:  154m OMNIPAQUE IOHEXOL 350 MG/ML SOLN COMPARISON:  Chest x-ray same day. FINDINGS: Cardiovascular: Heart is normal size. Thoracic aorta is normal. Pulmonary arterial system is adequately opacified without evidence of emboli. Remaining vascular structures are unremarkable. Mediastinum/Nodes: No significant mediastinal or hilar adenopathy. Remaining mediastinal structures are normal. Lungs/Pleura: Lungs are adequately inflated. Subtle linear atelectasis/scarring over the lingula. Minimal atelectasis over the posterior right base. No definite airspace consolidation or effusion. Airways are normal. Upper Abdomen: No acute findings. Musculoskeletal: No focal abnormality. Review of the MIP images confirms the above findings. IMPRESSION: No evidence of acute pulmonary emboli and no  evidence of acute cardiopulmonary disease. Electronically Signed   By: DMarin OlpM.D.   On: 11/12/2021 11:56   DG Chest 2 View  Result Date: 11/12/2021 CLINICAL DATA:  41year old female with shortness of breath, bilateral lower extremity swelling. EXAM: CHEST - 2 VIEW COMPARISON:  Portable chest 09/08/2020. FINDINGS: PA and lateral chest radiographs 0900 hours. Stable somewhat low lung volumes. Normal cardiac size and mediastinal contours. Visualized tracheal air column is within normal limits. Lung markings appear stable and both lungs appear clear. No pneumothorax or pleural effusion. No acute osseous abnormality identified. Negative visible bowel gas. IMPRESSION: Negative, no cardiopulmonary abnormality. Electronically Signed   By: HGenevie AnnM.D.   On: 11/12/2021 09:14    Procedures Procedures   Medications Ordered in ED Medications  sodium chloride 0.9 % bolus 500 mL (500 mLs Intravenous New Bag/Given 11/12/21 1220)  iohexol (OMNIPAQUE) 350 MG/ML injection 100 mL (100 mLs Intravenous Contrast Given 11/12/21 1144)    ED Course/ Medical Decision Making/ A&P Clinical Course as of 11/12/21 1256  Sat Nov 12, 2021  1116 D-Dimer, Quant(!): 1.13 [JS]    Clinical Course User Index [JS] SJaneece Fitting PA-C                           Medical Decision Making Amount and/or Complexity of Data Reviewed Labs: ordered. Decision-making details documented in ED Course. Radiology: ordered.  Risk Prescription drug management.   This patient presents to the ED for concern of leg swelling, this involves a number of treatment options, and is a complaint that carries with it a high risk of complications and morbidity.  The differential diagnosis includes cellulitis versus heart failure versus DVT.   Co morbidities: Discussed in HPI   Brief History:  With ongoing past medical history presents to the ED with right leg swelling which has been ongoing for the past 2 weeks.  Evaluated by PCP sent in  to rule out DVT.  No prior history of DVT, currently on no OCPs.  Endorsing shortness of breath with any type of movement.  EMR reviewed including pt PMHx, past surgical history and past visits to ER.   See HPI for more details   Lab Tests:  I ordered and independently interpreted labs.  The pertinent results include:    I personally reviewed all laboratory work and imaging. Metabolic panel without any acute abnormality specifically kidney function within normal limits and no significant electrolyte  abnormalities. CBC without leukocytosis or significant anemia.  BNP is within normal limits.  hCG is negative.  Her D-dimer was elevated.  Discussed risks and benefits of obtaining imaging at this time.  Patient is agreeable with proceeding with CT angio.   Imaging Studies:  This study without any evidence of blood clot.  CT angio chest showed; No acute pulmonary embolism noted.  Cardiac Monitoring:  The patient was maintained on a cardiac monitor.  I personally viewed and interpreted the cardiac monitored which showed an underlying rhythm of: NSR EKG non-ischemic   Medicines ordered:  I ordered medication including bolus  for hydration  Reevaluation of the patient after these medicines showed that the patient improved I have reviewed the patients home medicines and have made adjustments as needed  Reevaluation:  After the interventions noted above I re-evaluated patient and found that they have :stayed the same   Social Determinants of Health:  The patient's social determinants of health were a factor in the care of this patient    Problem List / ED Course:  Patient presents to the ED with a chief complaint of shortness of breath along with leg swelling.  Patient's PCP is concerned for DVT.  She did have an ultrasound DVT which did not show any acute DVT on today's visit.  Labs are within her normal, her D-dimer was elevated therefore it was proceeded with a CT angio which  did not show any pulmonary embolism.  BNP is within normal limits.  Her chest x-ray is without any pneumonia or acute finding.  Patient does have redness along with changes in the skin to the right lower leg, some suspicion for early cellulitis I discussed risks and benefits of treatment at this time with patient.  She is agreeable with plan and treatment.  She is stable for discharge.   Dispostion:  After consideration of the diagnostic results and the patients response to treatment, I feel that the patent would benefit from outpatient treatment with Bactrim for treatment of cellulitis.  She does have appropriate PCP follow-up.     Portions of this note were generated with [employer]. Dictation errors may occur despite best attempts at proofreading.   Final Clinical Impression(s) / ED Diagnoses Final diagnoses:  Leg swelling  Shortness of breath  Cellulitis of right lower extremity    Rx / DC Orders ED Discharge Orders          Ordered    sulfamethoxazole-trimethoprim (BACTRIM DS) 800-160 MG tablet  2 times daily        11/12/21 1252              Janeece Fitting, PA-C 11/12/21 1256    Luna Fuse, MD 11/14/21 [phone]

## 2021-11-12 NOTE — ED Notes (Signed)
Patient transported to CT 

## 2021-11-12 NOTE — ED Triage Notes (Signed)
Patient complains of right lower leg redness and pain with swelling bilateral lower legs x 2 weeks. Denies drainage and no hx of DVT

## 2021-12-08 ENCOUNTER — Ambulatory Visit (INDEPENDENT_AMBULATORY_CARE_PROVIDER_SITE_OTHER): Payer: Self-pay

## 2021-12-08 ENCOUNTER — Ambulatory Visit (INDEPENDENT_AMBULATORY_CARE_PROVIDER_SITE_OTHER): Payer: Self-pay | Admitting: Orthopaedic Surgery

## 2021-12-08 DIAGNOSIS — S92421A Displaced fracture of distal phalanx of right great toe, initial encounter for closed fracture: Secondary | ICD-10-CM | POA: Diagnosis not present

## 2021-12-08 NOTE — Progress Notes (Signed)
Chief Complaint: Right first toe fracture     History of Present Illness:   12/08/2021: Presents today for follow-up of her right first toe fracture.  She has been in a hard sole shoe.  She is now walking with only mild antalgic gait.  She is still experiencing swelling and pain about the foot.  Jill Stevenson is a 41 y.o. female presents today with a first toe distal phalanx fracture on the right after she tripped over a large shoe.  Since that time she went to the emergency room was placed in a hard soled shoe.  She was told to keep weight off of it.  She is currently taking tramadol she does not help with the pain.  She is unable to sleep as result of it.  She is having a hard time difficult to cannulate.  Here today for further assessment    Surgical History:   None  PMH/PSH/Family History/Social History/Meds/Allergies:    Past Medical History:  Diagnosis Date   Hypertension    Medical history non-contributory    Past Surgical History:  Procedure Laterality Date   CESAREAN SECTION     Social History   Socioeconomic History   Marital status: Single    Spouse name: Not on file   Number of children: Not on file   Years of education: Not on file   Highest education level: Not on file  Occupational History   Not on file  Tobacco Use   Smoking status: Former    Packs/day: 0.25    Types: Cigarettes    Quit date: 07/27/2021    Years since quitting: 0.3   Smokeless tobacco: Never  Vaping Use   Vaping Use: Every day  Substance and Sexual Activity   Alcohol use: Yes    Comment: occasionally   Drug use: No   Sexual activity: Yes    Birth control/protection: Inserts  Other Topics Concern   Not on file  Social History Narrative   Not on file   Social Determinants of Health   Financial Resource Strain: Not on file  Food Insecurity: Not on file  Transportation Needs: Not on file  Physical Activity: Not on file  Stress: Not on file   Social Connections: Not on file   Family History  Problem Relation Age of Onset   Breast cancer Maternal Aunt    No Known Allergies Current Outpatient Medications  Medication Sig Dispense Refill   amLODipine (NORVASC) 10 MG tablet Take 10 mg by mouth daily.     etonogestrel-ethinyl estradiol (NUVARING) 0.12-0.015 MG/24HR vaginal ring Insert vaginally and leave in place for 3 consecutive weeks, then remove for 1 week. 1 each 12   traMADol (ULTRAM) 50 MG tablet Take 1 tablet (50 mg total) by mouth every 6 (six) hours as needed. 15 tablet 0   No current facility-administered medications for this visit.   No results found.  Review of Systems:   A ROS was performed including pertinent positives and negatives as documented in the HPI.  Physical Exam :   Constitutional: NAD and appears stated age Neurological: Alert and oriented Psych: Appropriate affect and cooperative Last menstrual period 10/16/2021.   Comprehensive Musculoskeletal Exam:    Tenderness elevation about the distal phalanx of the right great toe.  She walks with an antalgic gait.  She does have bilateral lower extremity edema which is chronic and baseline for her.  Otherwise warm and well-perfused toes  Imaging:   Xray (3 views right foot): Nondisplaced distal phalanx fracture of the right great toe.  X-rays today do show some callus formation across the fracture site   I personally reviewed and interpreted the radiographs.   Assessment:   41 y.o. female with a right distal phalanx fracture of the great toe.  Overall I do believe that there is increasing callus formation consistent with a healing fracture.  At this time I will plan to follow her injury clinically and I will plan to see her back on an as-needed basis  Plan :    -Return to clinic as needed   I personally saw and evaluated the patient, and participated in the management and treatment plan.  Vanetta Mulders, MD Attending Physician, Orthopedic  Surgery  This document was dictated using Dragon voice recognition software. A reasonable attempt at proof reading has been made to minimize errors.

## 2022-02-16 DIAGNOSIS — H35412 Lattice degeneration of retina, left eye: Secondary | ICD-10-CM | POA: Diagnosis not present

## 2022-02-16 DIAGNOSIS — H33322 Round hole, left eye: Secondary | ICD-10-CM | POA: Diagnosis not present

## 2022-02-16 DIAGNOSIS — H43813 Vitreous degeneration, bilateral: Secondary | ICD-10-CM | POA: Diagnosis not present

## 2022-02-16 DIAGNOSIS — H31091 Other chorioretinal scars, right eye: Secondary | ICD-10-CM | POA: Diagnosis not present

## 2022-03-08 ENCOUNTER — Ambulatory Visit: Payer: Medicaid Other | Admitting: Obstetrics and Gynecology

## 2023-01-02 IMAGING — CT CT ANGIO CHEST
2 of 6 series · 18 of 36 positions shown · IV contrast (agent unspecified)
Comparison: Chest x-ray same day.

CLINICAL DATA: Chest pain and shortness of breath.

EXAM:
CT ANGIOGRAPHY CHEST WITH CONTRAST
TECHNIQUE: Multidetector CT imaging of the chest was performed using the
standard protocol during bolus administration of intravenous
contrast. Multiplanar CT image reconstructions and MIPs were
obtained to evaluate the vascular anatomy.

[Series 7: pe thins · axial · 0.84mm/px · z∈[+1040,+1285]mm · 17 of 185 slices shown]
[im 11/185  lung]
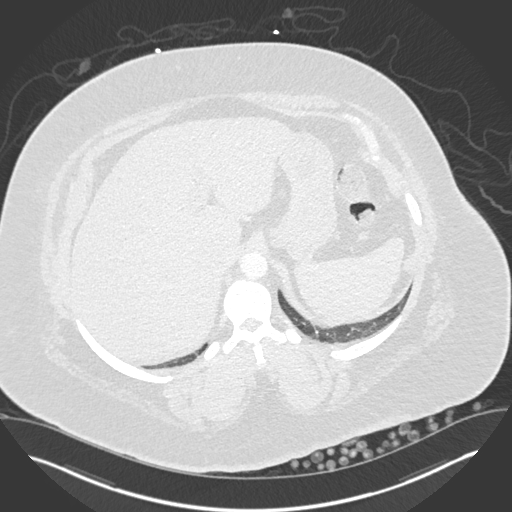
[im 21/185  mediastinal]
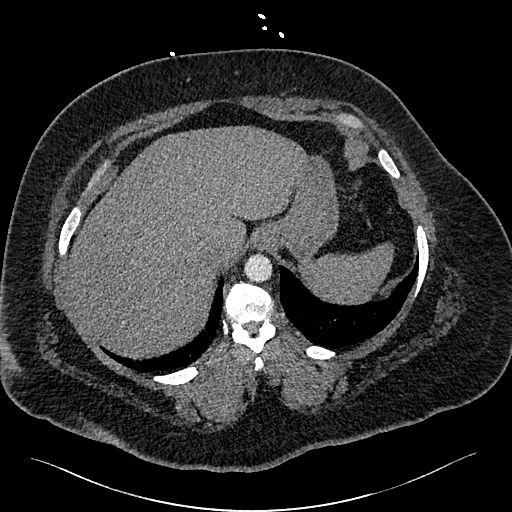
[im 31/185  lung]
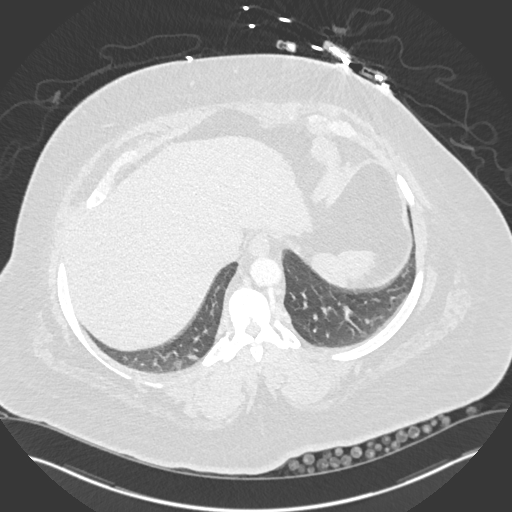
[im 41/185  mediastinal]
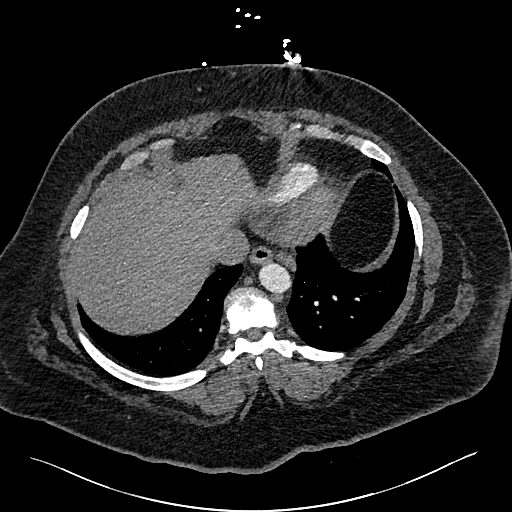
[im 52/185  lung]
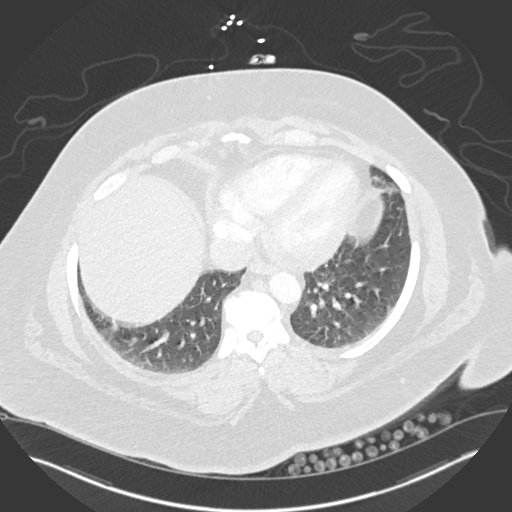
[im 62/185  mediastinal]
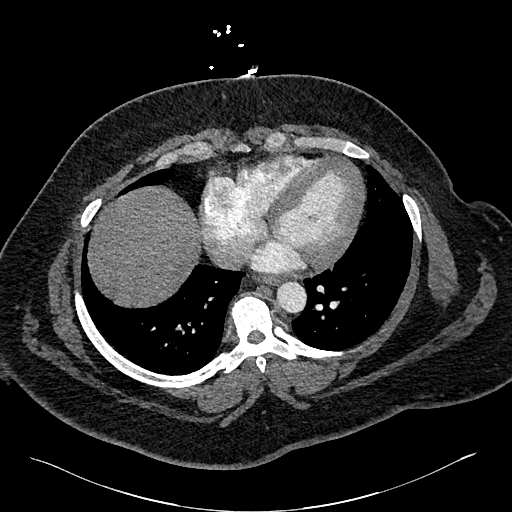
[im 72/185  lung]
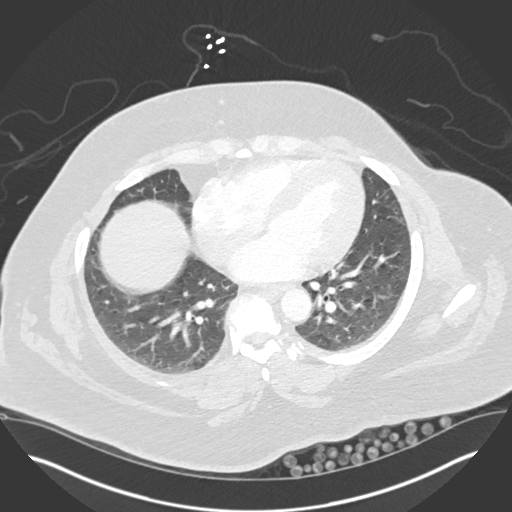
[im 82/185  mediastinal]
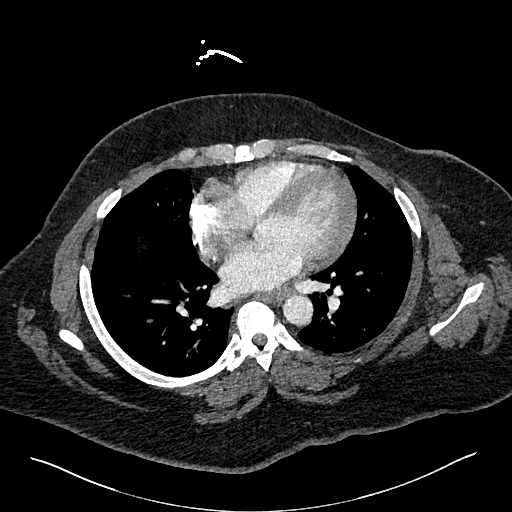
[im 93/185  lung]
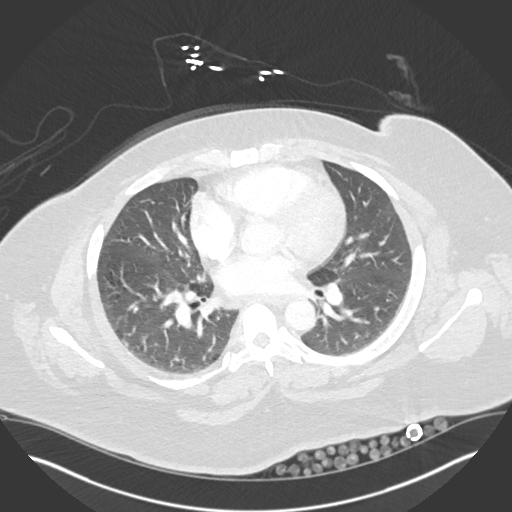
[im 103/185  mediastinal]
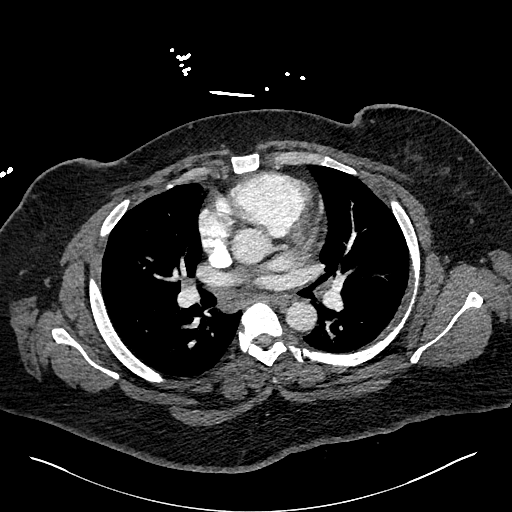
[im 113/185  lung]
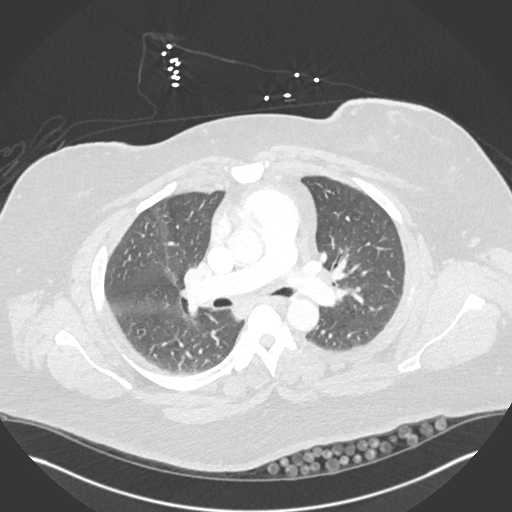
[im 123/185  mediastinal]
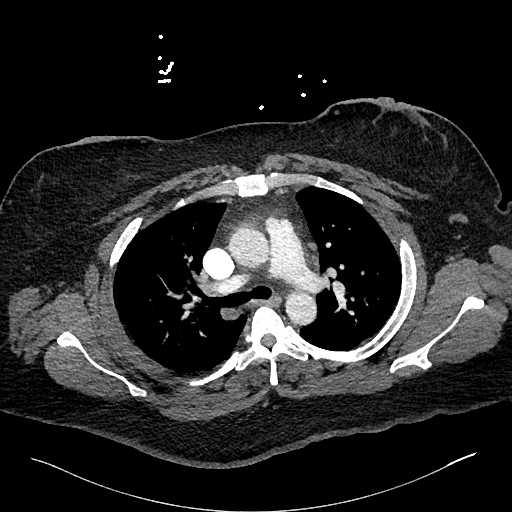
[im 133/185  lung]
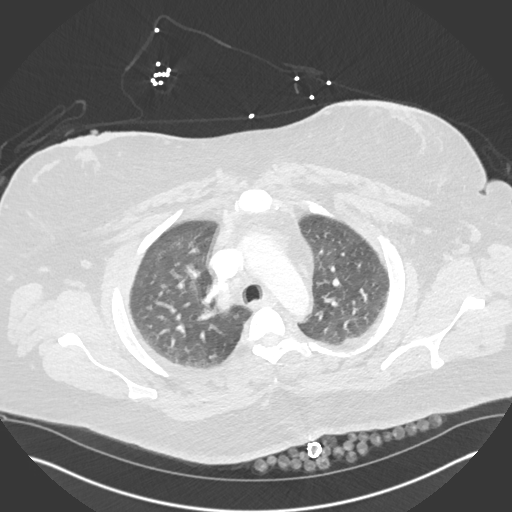
[im 144/185  mediastinal]
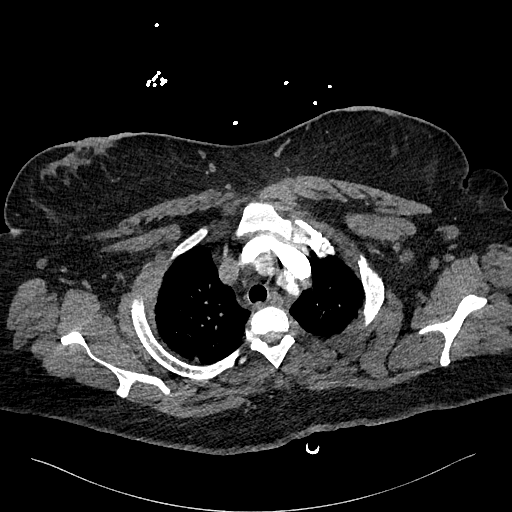
[im 154/185  lung]
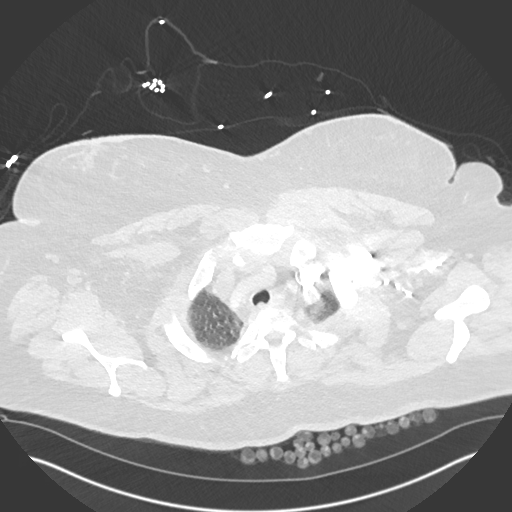
[im 164/185  mediastinal]
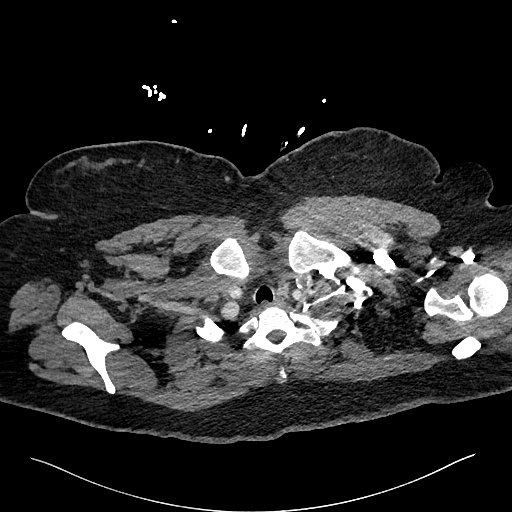
[im 174/185  lung]
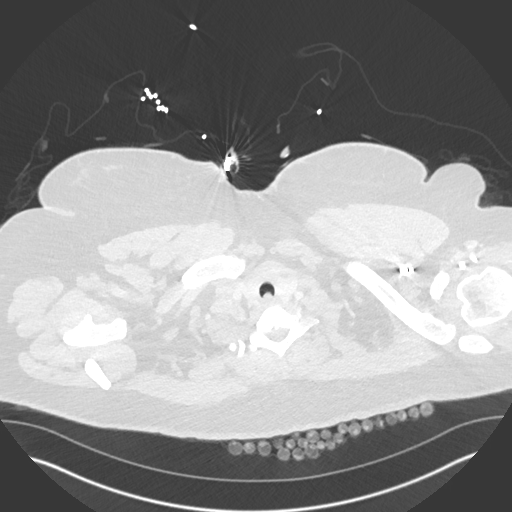

[Series 8: pe 2mm cor · coronal · 0.59mm/px · 1 of 101 slices shown]
[im 51/101  mediastinal]
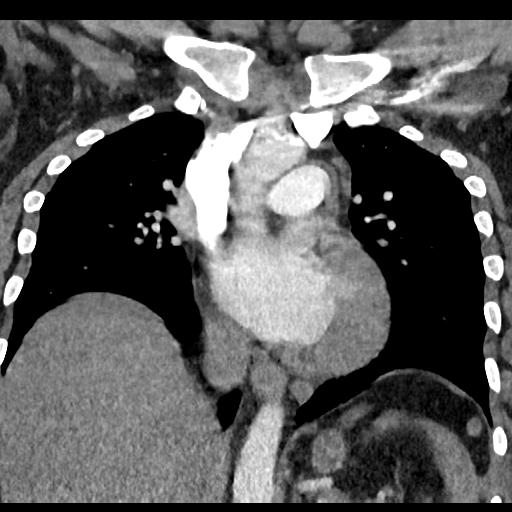

[18 of 36 positions shown; findings below may reference images not displayed]

RADIATION DOSE REDUCTION: This exam was performed according to the
departmental dose-optimization program which includes automated
exposure control, adjustment of the mA and/or kV according to
patient size and/or use of iterative reconstruction technique.

CONTRAST:  100mL OMNIPAQUE IOHEXOL 350 MG/ML SOLN
FINDINGS: Cardiovascular: Heart is normal size. Thoracic aorta is normal.
Pulmonary arterial system is adequately opacified without evidence
of emboli. Remaining vascular structures are unremarkable.

Mediastinum/Nodes: No significant mediastinal or hilar adenopathy.
Remaining mediastinal structures are normal.

Lungs/Pleura: Lungs are adequately inflated. Subtle linear
atelectasis/scarring over the lingula. Minimal atelectasis over the
posterior right base. No definite airspace consolidation or
effusion. Airways are normal.

Upper Abdomen: No acute findings.

Musculoskeletal: No focal abnormality.

Review of the MIP images confirms the above findings.
IMPRESSION: No evidence of acute pulmonary emboli and no evidence of acute
cardiopulmonary disease.

## 2023-01-02 IMAGING — CR DG CHEST 2V
2 series · 2 of 2 positions shown · non-contrast
Comparison: Portable chest 09/08/2020.

CLINICAL DATA: 40-year-old female with shortness of breath,
bilateral lower extremity swelling.

EXAM:
CHEST - 2 VIEW

[chest pa]
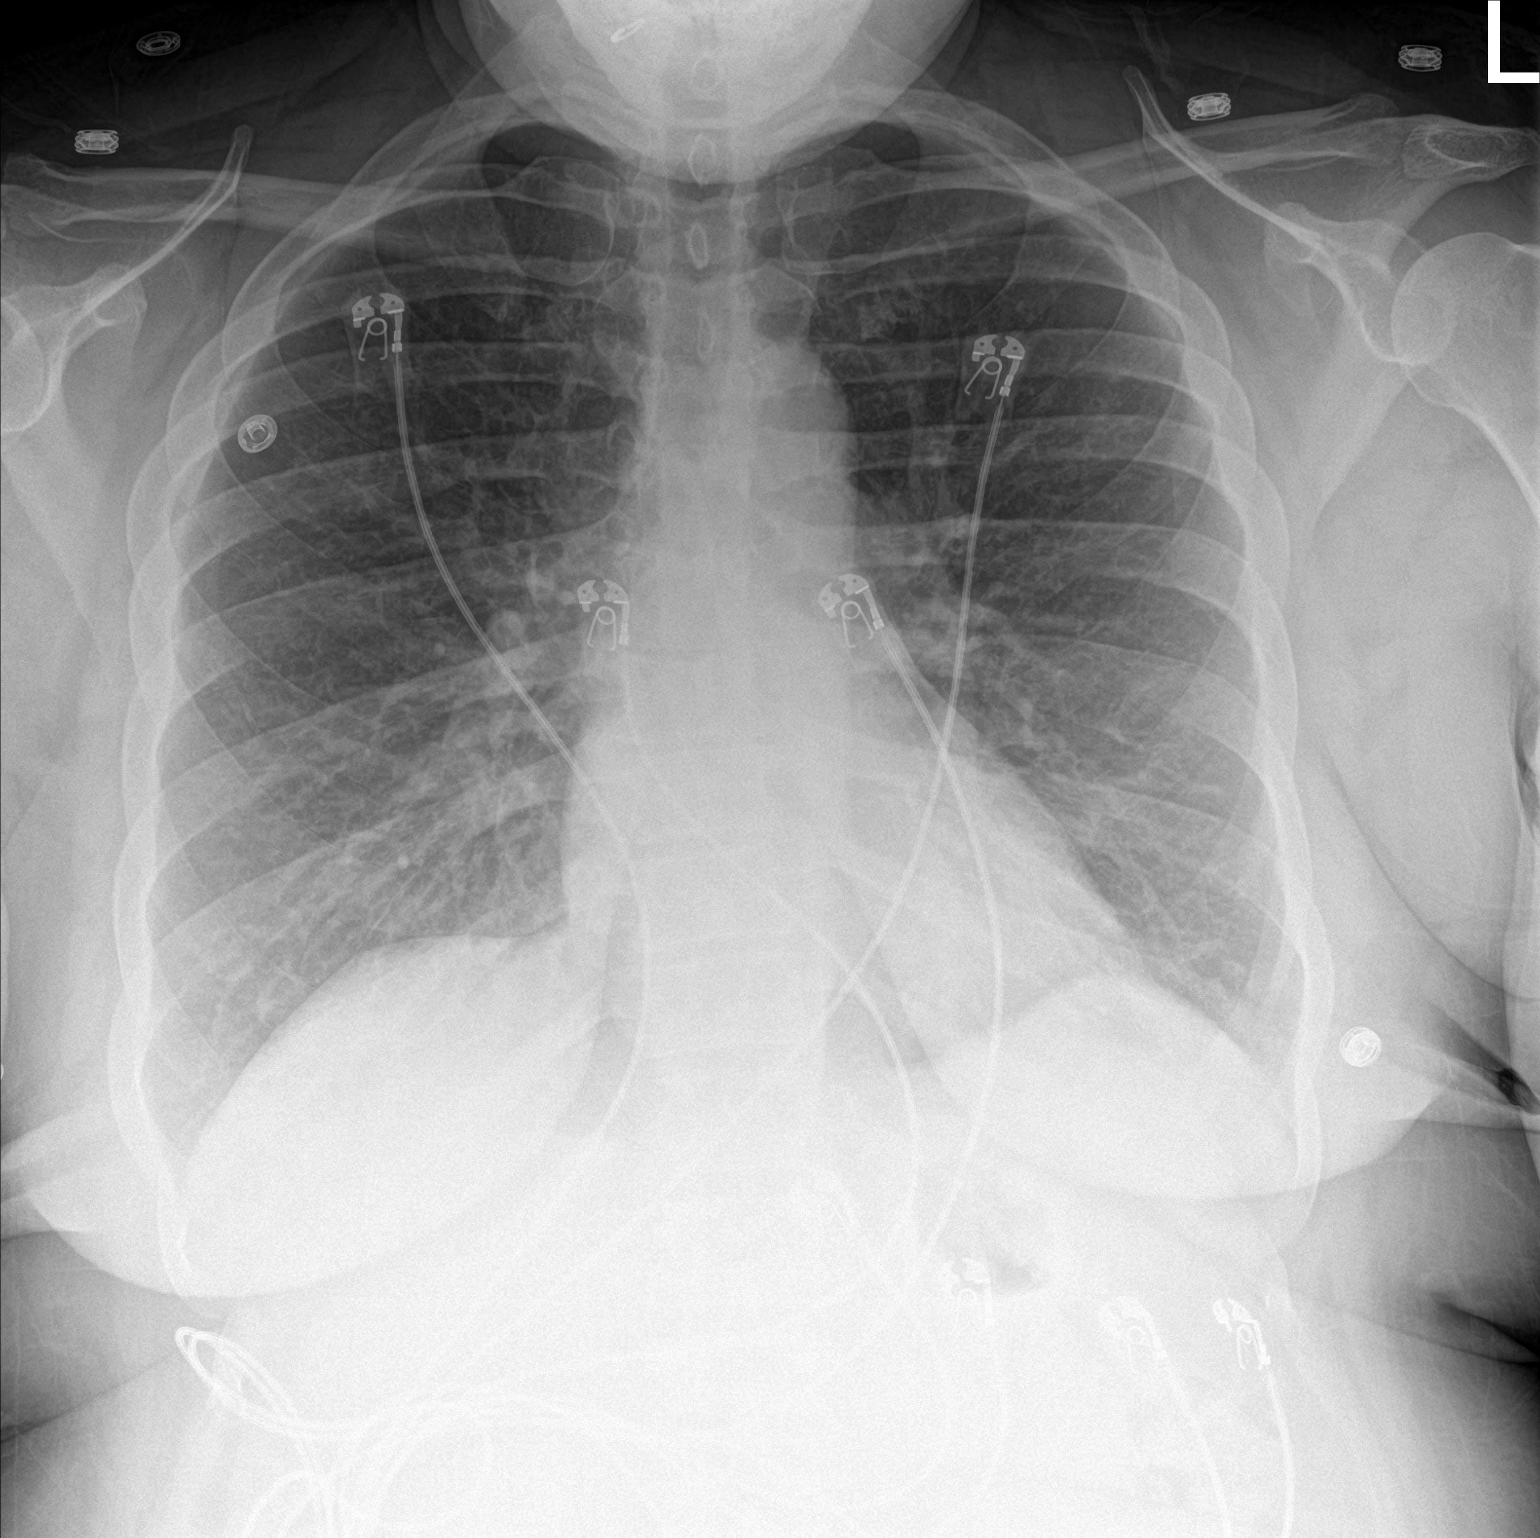

[chest lat]
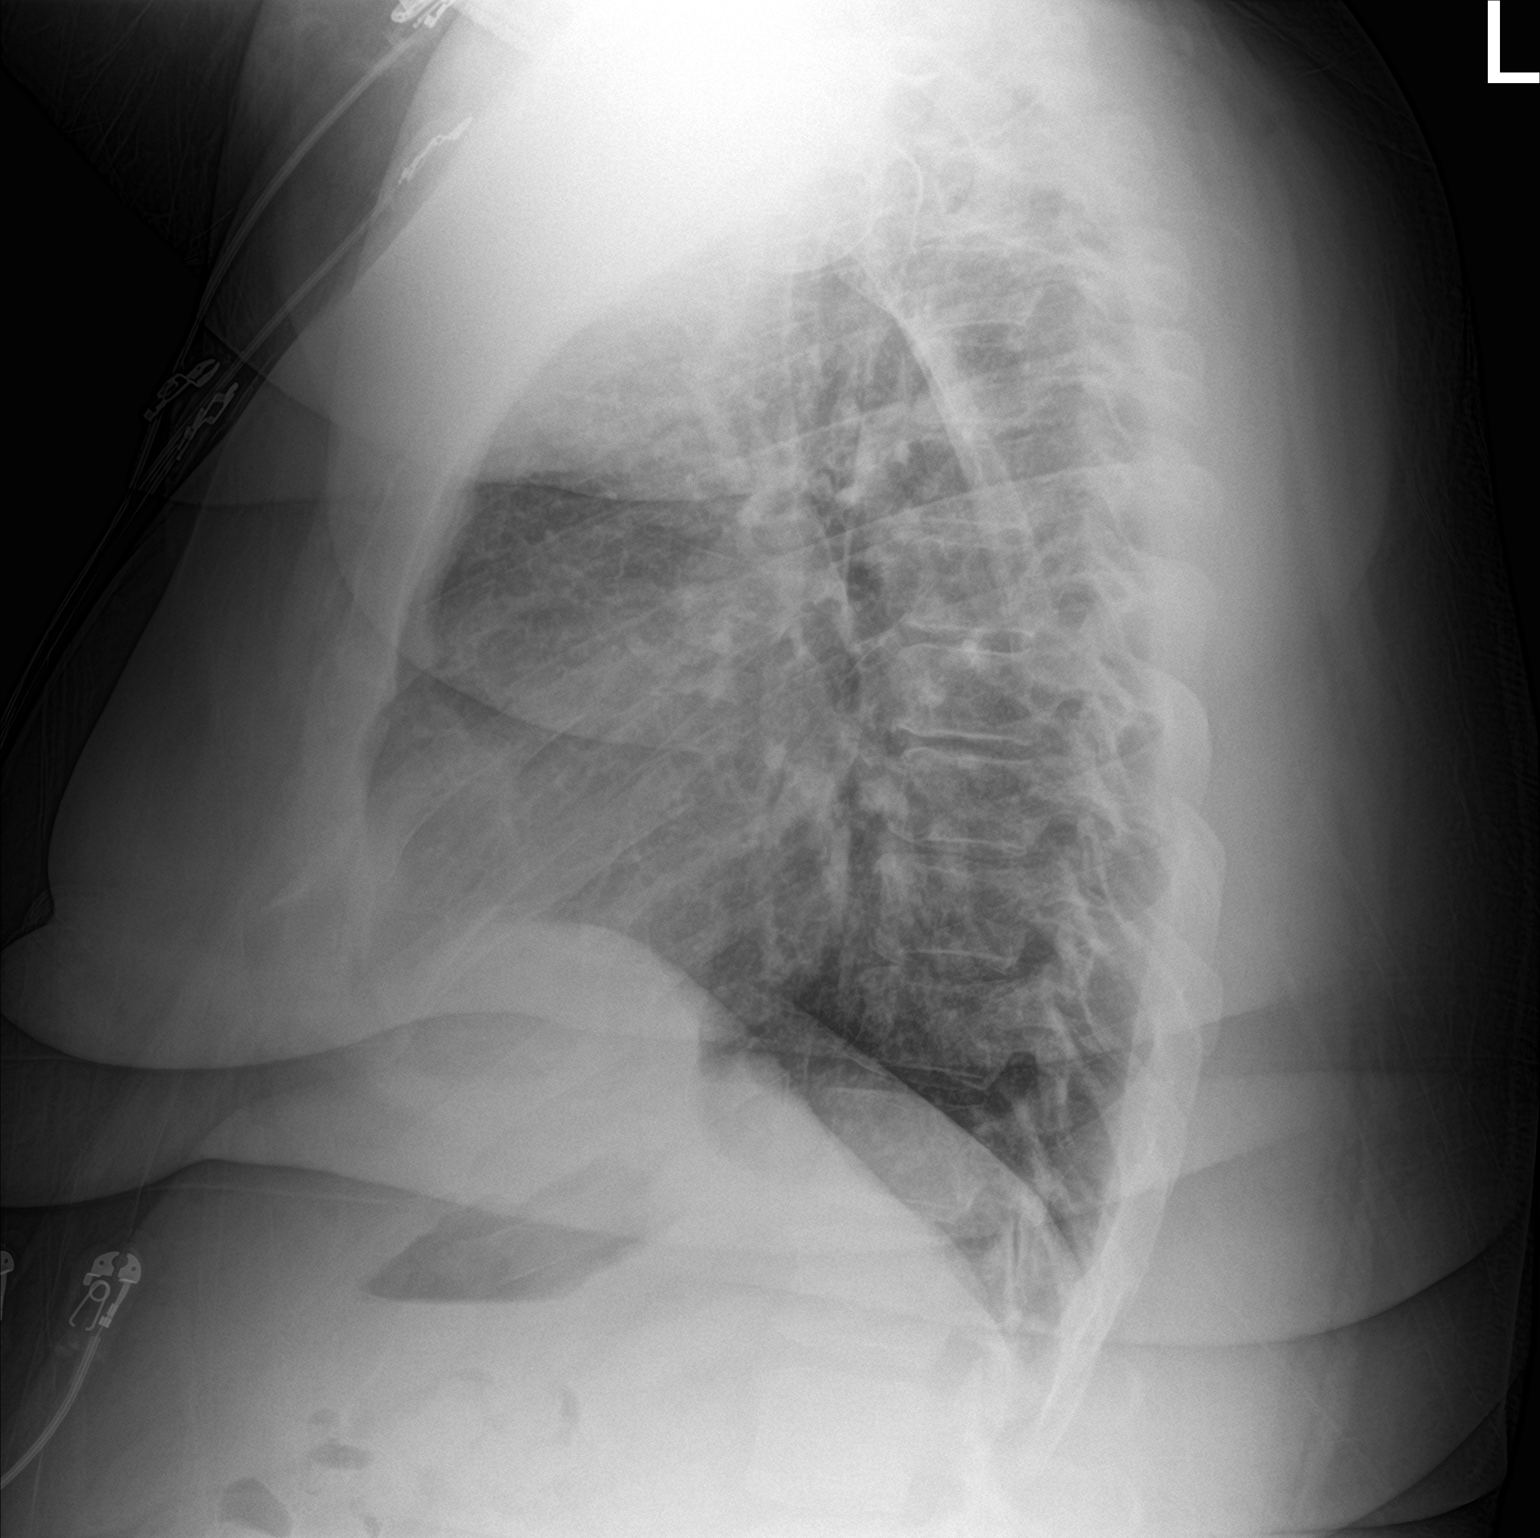

[2 of 2 positions shown; findings below may reference images not displayed]

FINDINGS: PA and lateral chest radiographs 3033 hours. Stable somewhat low
lung volumes. Normal cardiac size and mediastinal contours.
Visualized tracheal air column is within normal limits. Lung
markings appear stable and both lungs appear clear. No pneumothorax
or pleural effusion.

No acute osseous abnormality identified. Negative visible bowel gas.
IMPRESSION: Negative, no cardiopulmonary abnormality.

## 2023-08-15 ENCOUNTER — Encounter: Payer: Medicaid Other | Admitting: Obstetrics & Gynecology

## 2023-09-08 ENCOUNTER — Ambulatory Visit (HOSPITAL_COMMUNITY)
Admission: EM | Admit: 2023-09-08 | Discharge: 2023-09-08 | Disposition: A | Discharge status: Home / Self Care | Attending: Internal Medicine | Admitting: Internal Medicine

## 2023-09-08 ENCOUNTER — Other Ambulatory Visit: Payer: Self-pay

## 2023-09-08 ENCOUNTER — Encounter (HOSPITAL_COMMUNITY): Payer: Self-pay | Admitting: *Deleted

## 2023-09-08 ENCOUNTER — Emergency Department (HOSPITAL_COMMUNITY)
Admission: EM | Admit: 2023-09-08 | Discharge: 2023-09-08 | Disposition: A | Discharge status: Home / Self Care | Attending: Emergency Medicine | Admitting: Emergency Medicine

## 2023-09-08 ENCOUNTER — Emergency Department (HOSPITAL_BASED_OUTPATIENT_CLINIC_OR_DEPARTMENT_OTHER)

## 2023-09-08 ENCOUNTER — Ambulatory Visit (INDEPENDENT_AMBULATORY_CARE_PROVIDER_SITE_OTHER)

## 2023-09-08 ENCOUNTER — Encounter (HOSPITAL_COMMUNITY): Payer: Self-pay

## 2023-09-08 DIAGNOSIS — M25571 Pain in right ankle and joints of right foot: Secondary | ICD-10-CM | POA: Diagnosis not present

## 2023-09-08 DIAGNOSIS — M25561 Pain in right knee: Secondary | ICD-10-CM

## 2023-09-08 DIAGNOSIS — M79661 Pain in right lower leg: Secondary | ICD-10-CM | POA: Diagnosis not present

## 2023-09-08 DIAGNOSIS — M1711 Unilateral primary osteoarthritis, right knee: Secondary | ICD-10-CM | POA: Diagnosis not present

## 2023-09-08 DIAGNOSIS — I10 Essential (primary) hypertension: Secondary | ICD-10-CM | POA: Diagnosis not present

## 2023-09-08 DIAGNOSIS — M79604 Pain in right leg: Secondary | ICD-10-CM | POA: Diagnosis present

## 2023-09-08 DIAGNOSIS — Z79899 Other long term (current) drug therapy: Secondary | ICD-10-CM | POA: Insufficient documentation

## 2023-09-08 DIAGNOSIS — R000 Tachycardia, unspecified: Secondary | ICD-10-CM | POA: Diagnosis not present

## 2023-09-08 MED ORDER — OXYCODONE HCL 5 MG PO TABS: 5.0000 mg | ORAL_TABLET | Freq: Four times a day (QID) | ORAL | 0 refills | Status: AC | PRN

## 2023-09-08 MED ORDER — KETOROLAC TROMETHAMINE 15 MG/ML IJ SOLN
15.0000 mg | Freq: Once | INTRAMUSCULAR | Status: AC
  Administered 2023-09-08: 15 mg via INTRAMUSCULAR
  Filled 2023-09-08: qty 1

## 2023-09-08 MED ORDER — OXYCODONE-ACETAMINOPHEN 5-325 MG PO TABS
1.0000 | ORAL_TABLET | Freq: Once | ORAL | Status: AC
  Administered 2023-09-08: 1 via ORAL
  Filled 2023-09-08: qty 1

## 2023-09-08 NOTE — ED Notes (Signed)
 Patient is being discharged from the Urgent Care and sent to the Emergency Department via private vehicle . Per Lorenzo Romberg, [place], patient is in need of higher level of care due to leg pain, rule out DVT. Patient is aware and verbalizes understanding of plan of care.  Vitals:   09/08/23 1530  BP: (!) 153/110  Pulse: (!) 105  Resp: 20  Temp: 98.8 F (37.1 C)  SpO2: 98%

## 2023-09-08 NOTE — Discharge Instructions (Addendum)
 As discussed, your x-ray did not show any signs of fracture or dislocation.  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  You also have a density in the right ankle that could be overgrowth of bone, please make your orthopedic provider aware of this.  I have included the x-ray read below for your reference.  Your ultrasound did not show any signs of a blood clot.  You may take up to 1000mg  of tylenol every 6 hours as needed for pain.  Do not take more then 4g per day.  You may use up to 600mg  ibuprofen every 6 hours as needed for pain.  Do not exceed 2.4g of ibuprofen per day.  You have been prescribed Oxycodone-this is a narcotic/controlled substance medication that has potential addicting qualities.  You may take 1 tablet every 6 hours as needed for severe pain not controlled with Tylenol and ibuprofen.  Do not drive or operate heavy machinery when taking this medicine as it can be sedating. Do not drink alcohol or take other sedating medications when taking this medicine for safety reasons.  Keep this out of reach of small children.     You may use the crutches provided to help take weight off of the right knee to help you ambulate.  You may ice and elevate the knee and calf to help with pain and swelling.  As discussed, please follow-up with an orthopedic provider soon as possible for further management.  I have included information for office below.  Please contact them to schedule a follow-up appointment.  Return to the ER if you have any numbness in your foot, redness in your calf or knee, inability to move your knee, any other new or concerning symptoms.

## 2023-09-08 NOTE — ED Triage Notes (Signed)
 PT reports having RT calf pain since Monday morning. Pt was stretching when pain started ,

## 2023-09-08 NOTE — Progress Notes (Signed)
 Orthopedic Tech Progress Note Patient Details:  Jill Stevenson [birth year] [id number]  Ortho Devices Type of Ortho Device: Crutches Ortho Device/Splint Interventions: Ordered, Application, Adjustment   Post Interventions Patient Tolerated: Well Instructions Provided: Care of device, Adjustment of device  [name] 09/08/2023, 11:41 PM

## 2023-09-08 NOTE — ED Triage Notes (Signed)
 Pt POV from UC d/t right calf pain since Monday.  She had Xray there but sent here for US  to r/o DVT.

## 2023-09-08 NOTE — Discharge Instructions (Addendum)
 X-ray of the knee and ankle done and radiology review is still pending but given the level of pain present and the lack of specific injury, we are concerned that there is a more significant issue going on in the right leg.  This could even be something like a blood clot which would need to be ruled out with ultrasound.  At this time we recommend further evaluation in the emergency room where a higher acuity level of care can be given.

## 2023-09-08 NOTE — ED Provider Notes (Signed)
 [facility]    CSN: [id number] Arrival date & time: 09/08/23  1508      History   Chief Complaint Chief Complaint  Patient presents with  . Leg Pain    HPI Jill Stevenson is a 43 y.o. female.   43 year old female who presents urgent care with complaints of right leg pain.  This started on Monday when she was in bed stretching and she felt a significant pop and pain in her calf.  The pain is mostly in the upper portion of her calf into the back of her knee.  She is having minimal pain in the Achilles area.  She denies any other injury or previous injury to the area.  She denies a history of arthritis.  She was not standing when this happened and did not fall.  She has been taking ibuprofen and other over-the-counter pain medications without relief.  She has been able to walk but only in severe pain.  She reports that bending the knee or standing hurts and that it has been difficult for her to even sit with the knee bent.   Leg Pain Associated symptoms: no back pain and no fever     Past Medical History:  Diagnosis Date  . Hypertension   . Medical history non-contributory     Patient Active Problem List   Diagnosis Date Noted  . Chronic right shoulder pain 01/04/2021  . Numbness and tingling in right hand 01/04/2021  . Essential hypertension 01/04/2021    Past Surgical History:  Procedure Laterality Date  . CESAREAN SECTION      OB History     Gravida  3   Para  2   Term  2   Preterm      AB      Living  2      SAB      IAB      Ectopic      Multiple      Live Births               Home Medications    Prior to Admission medications   Medication Sig Start Date End Date Taking? Authorizing Provider  amLODipine (NORVASC) 10 MG tablet Take 10 mg by mouth daily.    [provider]  etonogestrel-ethinyl estradiol (NUVARING) 0.12-0.015 MG/24HR vaginal ring Insert vaginally and leave in place for 3 consecutive weeks, then  remove for 1 week. 01/02/19   Adam Phenix, MD  traMADol (ULTRAM) 50 MG tablet Take 1 tablet (50 mg total) by mouth every 6 (six) hours as needed. 11/06/21   Valinda Hoar, NP    Family History Family History  Problem Relation Age of Onset  . Breast cancer Maternal Aunt     Social History Social History   Tobacco Use  . Smoking status: Former    Current packs/day: 0.00    Types: Cigarettes    Quit date: 07/27/2021    Years since quitting: 2.1  . Smokeless tobacco: Never  Vaping Use  . Vaping status: Every Day  Substance Use Topics  . Alcohol use: Yes    Comment: occasionally  . Drug use: No     Allergies   Patient has no known allergies.   Review of Systems Review of Systems  Constitutional:  Negative for chills and fever.  HENT:  Negative for ear pain and sore throat.   Eyes:  Negative for pain and visual disturbance.  Respiratory:  Negative for cough  and shortness of breath.   Cardiovascular:  Negative for chest pain and palpitations.  Gastrointestinal:  Negative for abdominal pain and vomiting.  Genitourinary:  Negative for dysuria and hematuria.  Musculoskeletal:  Negative for arthralgias and back pain.       Right posterior knee pain/upper posterior calf pain  Skin:  Negative for color change and rash.  Neurological:  Negative for seizures and syncope.  All other systems reviewed and are negative.    Physical Exam Triage Vital Signs ED Triage Vitals  Encounter Vitals Group     BP 09/08/23 1530 (!) 153/110     Systolic BP Percentile --      Diastolic BP Percentile --      Pulse Rate 09/08/23 1530 (!) 105     Resp 09/08/23 1530 20     Temp 09/08/23 1530 98.8 F (37.1 C)     Temp src --      SpO2 09/08/23 1530 98 %     Weight --      Height --      Head Circumference --      Peak Flow --      Pain Score 09/08/23 1527 10     Pain Loc --      Pain Education --      Exclude from Growth Chart --    No data found.  Updated Vital Signs BP (!)  153/110   Pulse (!) 105   Temp 98.8 F (37.1 C)   Resp 20   LMP 08/09/2023   SpO2 98%   Visual Acuity Right Eye Distance:   Left Eye Distance:   Bilateral Distance:    Right Eye Near:   Left Eye Near:    Bilateral Near:     Physical Exam Vitals and nursing note reviewed.  Constitutional:      General: She is not in acute distress.    Appearance: She is well-developed.  HENT:     Head: Normocephalic and atraumatic.  Eyes:     Conjunctiva/sclera: Conjunctivae normal.  Cardiovascular:     Rate and Rhythm: Normal rate and regular rhythm.  Pulmonary:     Effort: Pulmonary effort is normal. No respiratory distress.  Abdominal:     Palpations: Abdomen is soft.     Tenderness: There is no abdominal tenderness.  Musculoskeletal:        General: No swelling.     Cervical back: Neck supple.     Right knee: Swelling present. Decreased range of motion.     Instability Tests: Medial McMurray test negative and lateral McMurray test negative.     Left knee: Swelling present.     Right lower leg: Swelling and tenderness (superior calf) present.     Left lower leg: Swelling present.  Skin:    General: Skin is warm and dry.     Capillary Refill: Capillary refill takes less than 2 seconds.  Neurological:     Mental Status: She is alert.  Psychiatric:        Mood and Affect: Mood normal.     UC Treatments / Results  Labs (all labs ordered are listed, but only abnormal results are displayed) Labs Reviewed - No data to display  EKG   Radiology No results found.  Procedures Procedures (including critical care time)  Medications Ordered in UC Medications - No data to display  Initial Impression / Assessment and Plan / UC Course  I have reviewed the triage vital signs and the nursing  notes.  Pertinent labs & imaging results that were available during my care of the patient were reviewed by me and considered in my medical decision making (see chart for details).      Acute pain of right knee - Plan: DG Knee Complete 4 Views Right, DG Knee Complete 4 Views Right  Right calf pain - Plan: DG Ankle Complete Right, DG Ankle Complete Right   X-ray of the knee and ankle done and radiology review is still pending but given the level of pain present and the lack of specific injury, we are concerned that there is a more significant issue going on in the right leg.  This could even be something like a blood clot which would need to be ruled out with ultrasound.  At this time we recommend further evaluation in the emergency room where a higher acuity level of care can be given.   Final Clinical Impressions(s) / UC Diagnoses   Final diagnoses:  Acute pain of right knee  Right calf pain   Discharge Instructions   None    ED Prescriptions   None    PDMP not reviewed this encounter.   Kreg Pesa, [place] 09/08/23 1649

## 2023-09-08 NOTE — ED Provider Notes (Signed)
 [facility] Provider Note   CSN: [id number] Arrival date & time: 09/08/23  1701     History  Chief Complaint  Patient presents with   Leg Pain    Jill Stevenson is a 43 y.o. female with hypertension, presents with concern for right calf pain ongoing for the past 7 days.  States she was stretching Monday and felt what she thought was a charley horse in her right calf.  Ever since then, the calf has been very sore and swollen.  She denies any numbness or tingling in her lower extremities, or pain that shoots down her leg.  She was seen at urgent care, but sent here to rule out DVT.   Leg Pain Associated symptoms: no fever        Home Medications Prior to Admission medications   Medication Sig Start Date End Date Taking? Authorizing Provider  oxyCODONE (ROXICODONE) 5 MG immediate release tablet Take 1 tablet (5 mg total) by mouth every 6 (six) hours as needed for up to 3 days for severe pain (pain score 7-10). 09/08/23 09/11/23 Yes Rexie Catena, PA-C  amLODipine (NORVASC) 10 MG tablet Take 10 mg by mouth daily.    [provider]  etonogestrel-ethinyl estradiol (NUVARING) 0.12-0.015 MG/24HR vaginal ring Insert vaginally and leave in place for 3 consecutive weeks, then remove for 1 week. 01/02/19   Tresia Fruit, MD      Allergies    Patient has no known allergies.    Review of Systems   Review of Systems  Constitutional:  Negative for fever.    Physical Exam Updated Vital Signs BP (!) 139/96 (BP Location: Right Wrist)   Pulse (!) 109   Temp 99.2 F (37.3 C) (Oral)   Resp 20   Ht 6' (1.829 m)   Wt (!) 172.4 kg   LMP 08/09/2023   SpO2 96%   BMI 51.54 kg/m  Physical Exam Vitals and nursing note reviewed.  Constitutional:      Appearance: Normal appearance.  HENT:     Head: Atraumatic.  Cardiovascular:     Rate and Rhythm: Regular rhythm. Tachycardia present.     Comments: 2+ dorsalis pedis pulse  bilaterally Pulmonary:     Effort: Pulmonary effort is normal.  Musculoskeletal:     Comments: Right lower extremity:  General No obvious deformity.  Right lower extremity from the knee down into the ankle appears more edematous than compared to the left side. No pitting edema. No erythema, contusions, open wounds   Palpation Tender to palpation in the popliteal fossa Mild tenderness to palpation over the calf diffusely No warmth to palpation of the knee or lower extremity. Non tender over the femur Nontender along the tibia and fibula, patella, MCL, LCL Nontender on the lateral and medial malleolus   ROM Flexes and extends right knee with difficulty due to pain  Able to ambulate with antalgic gait  Sensation: Sensation intact throughout the lower extremity  Strength: 5/5 strength with resisted ankle plantarflexion and dorsiflexion   Neurological:     General: No focal deficit present.     Mental Status: She is alert.  Psychiatric:        Mood and Affect: Mood normal.        Behavior: Behavior normal.     ED Results / Procedures / Treatments   Labs (all labs ordered are listed, but only abnormal results are displayed) Labs Reviewed - No data to display  EKG None  Radiology VAS US  LOWER EXTREMITY VENOUS (DVT) Result Date: 09/08/2023  Lower Venous DVT Study Patient Name:  Jill Stevenson  Date of Exam:   09/08/2023 Medical Rec #: [id number]       Accession #:    [id number] Date of Birth: December 05, 1980       Patient Gender: F Patient Age:   16 years Exam Location:  [facility] Procedure:      VAS US  LOWER EXTREMITY VENOUS (DVT) Referring Phys: CHASE COUNTRYMAN --------------------------------------------------------------------------------  Indications: Hamstring, knee, and calf pain.  Limitations: Body habitus. Comparison Study: No prior study on file Performing Technologist: Carleene Chase RVS  Examination Guidelines: A complete evaluation includes B-mode imaging,  spectral Doppler, color Doppler, and power Doppler as needed of all accessible portions of each vessel. Bilateral testing is considered an integral part of a complete examination. Limited examinations for reoccurring indications may be performed as noted. The reflux portion of the exam is performed with the patient in reverse Trendelenburg.  +---------+---------------+---------+-----------+----------+-------------------+ RIGHT    CompressibilityPhasicitySpontaneityPropertiesThrombus Aging      +---------+---------------+---------+-----------+----------+-------------------+ CFV      Full           Yes      Yes                                      +---------+---------------+---------+-----------+----------+-------------------+ SFJ      Full                                                             +---------+---------------+---------+-----------+----------+-------------------+ FV Prox  Full                                                             +---------+---------------+---------+-----------+----------+-------------------+ FV Mid   Full           Yes      Yes                                      +---------+---------------+---------+-----------+----------+-------------------+ FV DistalFull           Yes      Yes                                      +---------+---------------+---------+-----------+----------+-------------------+ PFV                                                   Not well visualized +---------+---------------+---------+-----------+----------+-------------------+ POP                     Yes      Yes                  patent  by color and                                                       Doppler             +---------+---------------+---------+-----------+----------+-------------------+ PTV                                                   Not well visualized  +---------+---------------+---------+-----------+----------+-------------------+ PERO                                                  Not well visualized +---------+---------------+---------+-----------+----------+-------------------+   +----+---------------+---------+-----------+----------+--------------+ LEFTCompressibilityPhasicitySpontaneityPropertiesThrombus Aging +----+---------------+---------+-----------+----------+--------------+ CFV                Yes      Yes                                 +----+---------------+---------+-----------+----------+--------------+     Summary: RIGHT: - There is no evidence of deep vein thrombosis in the lower extremity. However, portions of this examination were limited- see technologist comments above.  - No cystic structure found in the popliteal fossa.  LEFT: - No evidence of common femoral vein obstruction.   *See table(s) above for measurements and observations.    Preliminary    DG Knee Complete 4 Views Right Result Date: 09/08/2023 CLINICAL DATA:  Right calf pain, concern for Achilles injury. Pain behind knee with standing and bending. EXAM: RIGHT KNEE - COMPLETE 4+ VIEW; RIGHT ANKLE - COMPLETE 3+ VIEW COMPARISON:  None Available. FINDINGS: Right ankle: No evidence of acute fracture or dislocation. Degenerative changes are noted about the ankle. A large bony density is present along the along the region of the posterior process of the talus at the talonavicular articulation. No periosteal elevation or bony erosion is seen minimal enthesopathic changes are noted at the insertion site of the Achilles tendon. Diffuse soft tissue swelling is noted. Right knee: There is no evidence of acute fracture or dislocation. Moderate-to-severe degenerative changes are present at the knee, most pronounced in the patellofemoral compartment. There is a small suprapatellar joint effusion. The soft tissues are within normal limits. IMPRESSION: 1. No acute fracture  or dislocation at the right knee or right ankle. 2. Moderate-to-severe tricompartmental degenerative changes at the knee, most pronounced in the patellofemoral compartment. 3. Small suprapatellar joint effusion at the knee. 4. Degenerative changes at the ankle. 5. Large bony density is seen at the posterior aspect of the talocalcaneal articulation, possible osteochondroma or exostosis and likely chronic. Electronically Signed   By: Wyvonnia Heimlich M.D.   On: 09/08/2023 16:58   DG Ankle Complete Right Result Date: 09/08/2023 CLINICAL DATA:  Right calf pain, concern for Achilles injury. Pain behind knee with standing and bending. EXAM: RIGHT KNEE - COMPLETE 4+ VIEW; RIGHT ANKLE - COMPLETE 3+ VIEW COMPARISON:  None Available. FINDINGS: Right ankle: No evidence of acute fracture or dislocation. Degenerative changes are noted about the ankle. A large bony density is  present along the along the region of the posterior process of the talus at the talonavicular articulation. No periosteal elevation or bony erosion is seen minimal enthesopathic changes are noted at the insertion site of the Achilles tendon. Diffuse soft tissue swelling is noted. Right knee: There is no evidence of acute fracture or dislocation. Moderate-to-severe degenerative changes are present at the knee, most pronounced in the patellofemoral compartment. There is a small suprapatellar joint effusion. The soft tissues are within normal limits. IMPRESSION: 1. No acute fracture or dislocation at the right knee or right ankle. 2. Moderate-to-severe tricompartmental degenerative changes at the knee, most pronounced in the patellofemoral compartment. 3. Small suprapatellar joint effusion at the knee. 4. Degenerative changes at the ankle. 5. Large bony density is seen at the posterior aspect of the talocalcaneal articulation, possible osteochondroma or exostosis and likely chronic. Electronically Signed   By: Wyvonnia Heimlich M.D.   On: 09/08/2023 16:58     Procedures Procedures    Medications Ordered in ED Medications  ketorolac (TORADOL) 15 MG/ML injection 15 mg (15 mg Intramuscular Given 09/08/23 2116)  oxyCODONE-acetaminophen (PERCOCET/ROXICET) 5-325 MG per tablet 1 tablet (1 tablet Oral Given 09/08/23 2116)    ED Course/ Medical Decision Making/ A&P Clinical Course as of 09/08/23 2329  Sat Sep 08, 2023  2004 Stable Leg pain since Monday Can't bend knee. No redness or warmth. [CC]    Clinical Course User Index [CC] Onetha Bile, MD                                 Medical Decision Making Risk Prescription drug management.     Differential diagnosis includes but is not limited to fracture, dislocation, osteoarthritis, septic arthritis, compartment syndrome, DVT  ED Course:  Upon initial evaluation, patient appears to be in pain, reporting pain in the right knee into the right calf. Does have a tachycardia to 109 I suspect is due to pain, no other signs of infection such as fever or wounds. There is no edema or warmth to the right knee, able to flex and extend at right knee although with some pain, low concern for septic arthritis or gout at this time.  She does not have any overlying wounds to suggest cellulitis or infection.  Mildly tender to palpation.  Ultrasound of right lower extremity without sign of DVT.  X-ray of the right knee and right ankle were obtained at urgent care earlier today which did not show any acute abnormality besides tricompartmental osteoarthritis of the right knee which is likely contributing to the patient's pain.  She is neurovascular intact in the right lower extremity.  No concern for compartment syndrome.Low concern for any emergent pathology at this time.  Given Percocet and Toradol here for pain.  Upon reevaluation, reports improvement in pain.  She is provided crutches to help with ambulation, and was able to ambulate with slight antalgic gait.   Impression: Right calf pain for 1  week Right knee pain secondary to osteoarthritis  Disposition:  The patient was discharged home with instructions to take Tylenol and ibuprofen as needed for pain.  8 oxycodone prescribed for breakthrough pain after reviewing PDMP.  She is provided crutches to help with ambulation.  Will have her follow-up with orthopedics regarding her pain and further management.  Their contact information is provided, and she understands she needs to contact them to schedule an appointment. Return precautions given.    Record  Review: External records from outside source obtained and reviewed including  X-ray of the right ankle and right knee from urgent care earlier today reviewed, no acute fracture or dislocation.  Moderate to severe tricompartmental degenerative changes of the right knee.  Suprapatellar joint effusion at the right knee.  Large bony density seen in the posterior aspect of the talocalcaneal articulation, possible osteochondroma or exostosis that is likely chronic.     This chart was dictated using voice recognition software, Dragon. Despite the best efforts of this provider to proofread and correct errors, errors may still occur which can change documentation meaning.          Final Clinical Impression(s) / ED Diagnoses Final diagnoses:  Primary osteoarthritis of right knee  Right calf pain    Rx / DC Orders ED Discharge Orders          Ordered    oxyCODONE (ROXICODONE) 5 MG immediate release tablet  Every 6 hours PRN        09/08/23 2229              Rexie Catena, PA-C 09/08/23 2329    Onetha Bile, MD 09/09/23 1530

## 2023-09-08 NOTE — Progress Notes (Signed)
 VASCULAR LAB    Right lower extremity venous duplex has been performed.  See CV proc for preliminary results.  Relayed results to Rexie Catena, PA-C and Dr. Urban Garden via secure chat  Marquelle Balow, RVT 09/08/2023, 7:28 PM

## 2023-09-13 ENCOUNTER — Encounter: Payer: Self-pay | Admitting: Physician Assistant

## 2023-09-13 ENCOUNTER — Ambulatory Visit (INDEPENDENT_AMBULATORY_CARE_PROVIDER_SITE_OTHER): Admitting: Physician Assistant

## 2023-09-13 DIAGNOSIS — M25561 Pain in right knee: Secondary | ICD-10-CM | POA: Diagnosis not present

## 2023-09-13 DIAGNOSIS — M25562 Pain in left knee: Secondary | ICD-10-CM

## 2023-09-13 MED ORDER — METHYLPREDNISOLONE ACETATE 40 MG/ML IJ SUSP
3.0000 mg | INTRAMUSCULAR | Status: AC | PRN
  Administered 2023-09-13: 3 mg via INTRA_ARTICULAR

## 2023-09-13 MED ORDER — METHYLPREDNISOLONE ACETATE 40 MG/ML IJ SUSP
40.0000 mg | INTRAMUSCULAR | Status: AC | PRN
  Administered 2023-09-13: 40 mg via INTRA_ARTICULAR

## 2023-09-13 MED ORDER — LIDOCAINE HCL 1 % IJ SOLN
3.0000 mL | INTRAMUSCULAR | Status: AC | PRN
  Administered 2023-09-13: 3 mL

## 2023-09-13 NOTE — Progress Notes (Signed)
 Office Visit Note   Patient: Jill Stevenson           Date of Birth: 06-Jan-1981           MRN: [id number] Visit Date: 09/13/2023              Requested by: Grayce Sessions, NP [address] [place],  [place] [address] PCP: Grayce Sessions, NP   Assessment & Plan: Visit Diagnoses:  1. Pain in both knees, unspecified chronicity     Plan: Pleasant 43 year old woman with bilateral knee pain.  She has x-rays that show some degenerative changes especially in the left knee.  Also told she had some arthritis in the right knee.  She would like to try an injection.  She is not diabetic we will go forward with bilateral injections today can follow-up with me if she does not improve we got wrist we discussed weight loss which she is going to begin with as she is quite young for knee replacement  Follow-Up Instructions: Return if symptoms worsen or fail to improve.   Orders:  No orders of the defined types were placed in this encounter.  No orders of the defined types were placed in this encounter.     Procedures: Large Joint Inj: bilateral knee on 09/13/2023 3:13 PM Indications: pain and diagnostic evaluation Details: 25 G 1.5 in needle, anteromedial approach  Arthrogram: No  Medications (Right): 3 mL lidocaine 1 %; 40 mg methylPREDNISolone acetate 40 MG/ML Medications (Left): 3 mL lidocaine 1 %; 3 mg methylPREDNISolone acetate 40 MG/ML Outcome: tolerated well, no immediate complications Procedure, treatment alternatives, risks and benefits explained, specific risks discussed. Consent was given by the patient. Immediately prior to procedure a time out was called to verify the correct patient, procedure, equipment, support staff and site/side marked as required. Patient was prepped and draped in the usual sterile fashion.       Clinical Data: No additional findings.   Subjective: No chief complaint on file.   HPI Pleasant 43 year old woman with a list history  of bilateral tricompartmental arthritis.  Was suggested that she should come in and have tried knee injections she is not diabetic Review of Systems  All other systems reviewed and are negative.    Objective: Vital Signs: LMP 08/09/2023   Physical Exam Constitutional:      Appearance: Normal appearance.  Pulmonary:     Effort: Pulmonary effort is normal.  Skin:    General: Skin is warm and dry.  Neurological:     General: No focal deficit present.     Mental Status: She is alert.  Psychiatric:        Mood and Affect: Mood normal.        Behavior: Behavior normal.     Ortho Exam Bilateral knees no effusion no erythema compartments are soft and compressible she is neurovascular intact Specialty Comments:  No specialty comments available.  Imaging: No results found.   PMFS History: Patient Active Problem List   Diagnosis Date Noted   Bilateral knee pain 09/13/2023   Chronic right shoulder pain 01/04/2021   Numbness and tingling in right hand 01/04/2021   Essential hypertension 01/04/2021   Past Medical History:  Diagnosis Date   Hypertension    Medical history non-contributory     Family History  Problem Relation Age of Onset   Breast cancer Maternal Aunt     Past Surgical History:  Procedure Laterality Date   CESAREAN SECTION  Social History   Occupational History   Not on file  Tobacco Use   Smoking status: Former    Current packs/day: 0.00    Types: Cigarettes    Quit date: 07/27/2021    Years since quitting: 2.1   Smokeless tobacco: Never  Vaping Use   Vaping status: Every Day  Substance and Sexual Activity   Alcohol use: Yes    Comment: occasionally   Drug use: No   Sexual activity: Yes    Birth control/protection: Inserts

## 2023-09-25 ENCOUNTER — Telehealth (INDEPENDENT_AMBULATORY_CARE_PROVIDER_SITE_OTHER): Payer: Self-pay | Admitting: Primary Care

## 2023-09-25 NOTE — Telephone Encounter (Signed)
 Called pt to confirm appt. Pt will be present.

## 2023-10-02 ENCOUNTER — Telehealth (INDEPENDENT_AMBULATORY_CARE_PROVIDER_SITE_OTHER): Payer: Self-pay | Admitting: Primary Care

## 2023-10-02 NOTE — Telephone Encounter (Signed)
 Called pt to confirm appt. Pt will be present.

## 2023-10-03 ENCOUNTER — Ambulatory Visit (INDEPENDENT_AMBULATORY_CARE_PROVIDER_SITE_OTHER): Admitting: Primary Care

## 2023-10-03 VITALS — BP 135/95 | HR 97 | Resp 16 | Ht 72.0 in | Wt >= 6400 oz

## 2023-10-03 DIAGNOSIS — R7303 Prediabetes: Secondary | ICD-10-CM | POA: Diagnosis not present

## 2023-10-03 DIAGNOSIS — M79661 Pain in right lower leg: Secondary | ICD-10-CM

## 2023-10-03 DIAGNOSIS — Z09 Encounter for follow-up examination after completed treatment for conditions other than malignant neoplasm: Secondary | ICD-10-CM

## 2023-10-03 DIAGNOSIS — I10 Essential (primary) hypertension: Secondary | ICD-10-CM

## 2023-10-03 LAB — POCT GLYCOSYLATED HEMOGLOBIN (HGB A1C): HbA1c, POC (prediabetic range): 6.1 % (ref 5.7–6.4)

## 2023-10-03 NOTE — Progress Notes (Signed)
 Subjective:  [name] Stevenson is a 43 y.o. female presents for ED f/u   09/08/23,presents with concern for right calf pain ongoing for the past 7 days. She was stretching Monday and felt what she thought was a charley horse in her right calf. Since that time her , calf has been very sore and swollen. She was  discharged Primary osteoarthritis of right knee. (HTN)Patient has No headache, No chest pain, No abdominal pain - No Nausea, No new weakness tingling or numbness, No Cough - shortness of breath Denies polyuria, polydipsia, polyphasia or vision changes.  Does not check blood sugars at home.  Past Medical History:  Diagnosis Date   Hypertension    Medical history non-contributory      No Known Allergies  No current outpatient medications on file prior to visit.   No current facility-administered medications on file prior to visit.    Review of System: ROS Comprehensive ROS Pertinent positive and negative noted in HPI   Objective:  BP (!) 135/95 (BP Location: Right Arm, Patient Position: Sitting, Cuff Size: Large) Comment (Cuff Size): thigh  Pulse 97   Resp 16   Ht 6' (1.829 m)   Wt (!) 409 lb 9.6 oz (185.8 kg)   LMP 09/12/2023   SpO2 99%   BMI 55.55 kg/m   Filed Weights   10/03/23 1629  Weight: (!) 409 lb 9.6 oz (185.8 kg)    Physical Exam Vitals reviewed.  Constitutional:      Appearance: Normal appearance. She is obese.     Comments: morbid  HENT:     Head: Normocephalic.     Right Ear: Tympanic membrane, ear canal and external ear normal.     Left Ear: Tympanic membrane, ear canal and external ear normal.     Nose: Nose normal.     Mouth/Throat:     Mouth: Mucous membranes are moist.  Eyes:     Extraocular Movements: Extraocular movements intact.     Pupils: Pupils are equal, round, and reactive to light.  Cardiovascular:     Rate and Rhythm: Normal rate and regular rhythm.  Pulmonary:     Effort: Pulmonary effort is normal.     Breath sounds: Normal  breath sounds.  Abdominal:     General: Bowel sounds are normal. There is distension.     Palpations: Abdomen is soft.  Musculoskeletal:        General: Swelling present.     Cervical back: Normal range of motion and neck supple.     Comments: Right calf  Skin:    General: Skin is warm and dry.  Neurological:     Mental Status: She is alert and oriented to person, place, and time.  Psychiatric:        Mood and Affect: Mood normal.        Behavior: Behavior normal.        Thought Content: Thought content normal.     Assessment:  Jill Stevenson "Denys Flash" was seen today for hypertension, diabetes and knee pain.  Diagnoses and all orders for this visit:  Essential hypertension  BP goal - < 130/80 Explained that having normal blood pressure is the goal and medications are helping to get to goal and maintain normal blood pressure. DIET: Limit salt intake, read nutrition labels to check salt content, limit fried and high fatty foods  Avoid using multisymptom OTC cold preparations that generally contain sudafed which can rise BP. Consult with pharmacist on best cold  relief products to use for persons with HTN EXERCISE Discussed incorporating exercise such as walking - 30 minutes most days of the week and can do in 10 minute intervals    -     CBC with Differential/Platelet -     CMP14+EGFR  Prediabetes - educated on lifestyle modifications, including but not limited to diet choices and adding exercise to daily routine.   -     POCT glycosylated hemoglobin (Hb A1C) -     CBC with Differential/Platelet -     Lipid panel  Morbid obesity (HCC) -     Lipid panel -     Amb Ref to Medical Weight Management  Hospital discharge follow-up 2/2 Right calf pain  Follow-Ups: Follow up with [facility] (Orthopedics); As soon as possible for follow-up    This note has been created with Education officer, environmental. Any transcriptional  errors are unintentional.   Return in about 3 months (around 01/03/2024) for HTN.  Marius Siemens, NP 10/15/2023, 1:37 PM

## 2023-10-04 ENCOUNTER — Encounter (INDEPENDENT_AMBULATORY_CARE_PROVIDER_SITE_OTHER): Payer: Self-pay

## 2023-10-04 LAB — LIPID PANEL
Chol/HDL Ratio: 2.6 ratio (ref 0.0–4.4)
Cholesterol, Total: 202 mg/dL — ABNORMAL HIGH (ref 100–199)
HDL: 78 mg/dL (ref >39)
LDL Chol Calc (NIH): 109 mg/dL — ABNORMAL HIGH (ref 0–99)
Triglycerides: 83 mg/dL (ref 0–149)
VLDL Cholesterol Cal: 15 mg/dL (ref 5–40)

## 2023-10-04 LAB — CBC WITH DIFFERENTIAL/PLATELET
Basophils Absolute: 0 10*3/uL (ref 0.0–0.2)
Basos: 0 %
EOS (ABSOLUTE): 0.1 10*3/uL (ref 0.0–0.4)
Eos: 1 %
Hematocrit: 42.1 % (ref 34.0–46.6)
Hemoglobin: 13.8 g/dL (ref 11.1–15.9)
Immature Grans (Abs): 0 10*3/uL (ref 0.0–0.1)
Immature Granulocytes: 0 %
Lymphocytes Absolute: 2.7 10*3/uL (ref 0.7–3.1)
Lymphs: 29 %
MCH: 28.7 pg (ref 26.6–33.0)
MCHC: 32.8 g/dL (ref 31.5–35.7)
MCV: 88 fL (ref 79–97)
Monocytes Absolute: 0.6 10*3/uL (ref 0.1–0.9)
Monocytes: 7 %
Neutrophils Absolute: 5.7 10*3/uL (ref 1.4–7.0)
Neutrophils: 63 %
Platelets: 227 10*3/uL (ref 150–450)
RBC: 4.81 x10E6/uL (ref 3.77–5.28)
RDW: 13.9 % (ref 11.7–15.4)
WBC: 9.1 10*3/uL (ref 3.4–10.8)

## 2023-10-04 LAB — CMP14+EGFR
ALT: 26 IU/L (ref 0–32)
AST: 15 IU/L (ref 0–40)
Albumin: 4 g/dL (ref 3.9–4.9)
Alkaline Phosphatase: 97 IU/L (ref 44–121)
BUN/Creatinine Ratio: 19 (ref 9–23)
BUN: 18 mg/dL (ref 6–24)
Bilirubin Total: 0.2 mg/dL (ref 0.0–1.2)
CO2: 22 mmol/L (ref 20–29)
Calcium: 8.9 mg/dL (ref 8.7–10.2)
Chloride: 102 mmol/L (ref 96–106)
Creatinine, Ser: 0.96 mg/dL (ref 0.57–1.00)
Globulin, Total: 3.6 g/dL (ref 1.5–4.5)
Glucose: 87 mg/dL (ref 70–99)
Potassium: 3.9 mmol/L (ref 3.5–5.2)
Sodium: 141 mmol/L (ref 134–144)
Total Protein: 7.6 g/dL (ref 6.0–8.5)
eGFR: 76 mL/min/{1.73_m2} (ref >59)

## 2023-10-06 ENCOUNTER — Encounter (INDEPENDENT_AMBULATORY_CARE_PROVIDER_SITE_OTHER): Payer: Self-pay | Admitting: Primary Care

## 2023-10-10 DIAGNOSIS — M25562 Pain in left knee: Secondary | ICD-10-CM | POA: Diagnosis not present

## 2023-10-10 DIAGNOSIS — R7303 Prediabetes: Secondary | ICD-10-CM | POA: Diagnosis not present

## 2023-10-10 DIAGNOSIS — M25561 Pain in right knee: Secondary | ICD-10-CM | POA: Diagnosis not present

## 2023-10-10 DIAGNOSIS — Z6841 Body Mass Index (BMI) 40.0 and over, adult: Secondary | ICD-10-CM | POA: Diagnosis not present

## 2023-10-10 DIAGNOSIS — E66813 Obesity, class 3: Secondary | ICD-10-CM | POA: Diagnosis not present

## 2023-10-10 DIAGNOSIS — R5383 Other fatigue: Secondary | ICD-10-CM | POA: Diagnosis not present

## 2023-10-10 DIAGNOSIS — I119 Hypertensive heart disease without heart failure: Secondary | ICD-10-CM | POA: Diagnosis not present

## 2023-10-15 ENCOUNTER — Encounter (INDEPENDENT_AMBULATORY_CARE_PROVIDER_SITE_OTHER): Payer: Self-pay | Admitting: Primary Care

## 2023-10-17 DIAGNOSIS — M1712 Unilateral primary osteoarthritis, left knee: Secondary | ICD-10-CM | POA: Insufficient documentation

## 2023-10-17 DIAGNOSIS — M170 Bilateral primary osteoarthritis of knee: Secondary | ICD-10-CM | POA: Insufficient documentation

## 2023-10-17 DIAGNOSIS — M25562 Pain in left knee: Secondary | ICD-10-CM | POA: Insufficient documentation

## 2023-10-18 DIAGNOSIS — M79641 Pain in right hand: Secondary | ICD-10-CM | POA: Insufficient documentation

## 2023-10-18 DIAGNOSIS — M79645 Pain in left finger(s): Secondary | ICD-10-CM | POA: Insufficient documentation

## 2023-11-06 DIAGNOSIS — M170 Bilateral primary osteoarthritis of knee: Secondary | ICD-10-CM | POA: Diagnosis not present

## 2023-11-11 DIAGNOSIS — M79645 Pain in left finger(s): Secondary | ICD-10-CM | POA: Diagnosis not present

## 2023-11-14 DIAGNOSIS — R5383 Other fatigue: Secondary | ICD-10-CM | POA: Diagnosis not present

## 2023-11-14 DIAGNOSIS — E66813 Obesity, class 3: Secondary | ICD-10-CM | POA: Diagnosis not present

## 2023-11-14 DIAGNOSIS — I119 Hypertensive heart disease without heart failure: Secondary | ICD-10-CM | POA: Diagnosis not present

## 2023-11-14 DIAGNOSIS — R7303 Prediabetes: Secondary | ICD-10-CM | POA: Diagnosis not present

## 2023-11-14 DIAGNOSIS — M25562 Pain in left knee: Secondary | ICD-10-CM | POA: Diagnosis not present

## 2023-11-14 DIAGNOSIS — Z6841 Body Mass Index (BMI) 40.0 and over, adult: Secondary | ICD-10-CM | POA: Diagnosis not present

## 2023-11-19 DIAGNOSIS — R2232 Localized swelling, mass and lump, left upper limb: Secondary | ICD-10-CM | POA: Diagnosis not present

## 2023-11-19 DIAGNOSIS — M79645 Pain in left finger(s): Secondary | ICD-10-CM | POA: Diagnosis not present

## 2023-11-21 DIAGNOSIS — R2230 Localized swelling, mass and lump, unspecified upper limb: Secondary | ICD-10-CM | POA: Insufficient documentation

## 2023-12-10 DIAGNOSIS — M79645 Pain in left finger(s): Secondary | ICD-10-CM | POA: Diagnosis not present

## 2023-12-10 DIAGNOSIS — R2232 Localized swelling, mass and lump, left upper limb: Secondary | ICD-10-CM | POA: Diagnosis not present

## 2023-12-11 ENCOUNTER — Encounter: Payer: Self-pay | Admitting: Physician Assistant

## 2023-12-11 ENCOUNTER — Ambulatory Visit: Admitting: Physician Assistant

## 2023-12-11 DIAGNOSIS — G8929 Other chronic pain: Secondary | ICD-10-CM | POA: Diagnosis not present

## 2023-12-11 DIAGNOSIS — M25562 Pain in left knee: Secondary | ICD-10-CM

## 2023-12-11 DIAGNOSIS — M25561 Pain in right knee: Secondary | ICD-10-CM | POA: Diagnosis not present

## 2023-12-11 MED ORDER — METHYLPREDNISOLONE ACETATE 40 MG/ML IJ SUSP
40.0000 mg | INTRAMUSCULAR | Status: AC | PRN
Start: 2023-12-11 — End: 2023-12-11
  Administered 2023-12-11: 40 mg via INTRA_ARTICULAR

## 2023-12-11 MED ORDER — LIDOCAINE HCL 1 % IJ SOLN
4.0000 mL | INTRAMUSCULAR | Status: AC | PRN
  Administered 2023-12-11: 4 mL

## 2023-12-11 NOTE — Progress Notes (Signed)
 Office Visit Note   Patient: Jill Stevenson           Date of Birth: 09-20-1980           MRN: [id number] Visit Date: 12/11/2023              Requested by: Celestia Rosaline SQUIBB, NP [address] [place],  [place] [address] PCP: Celestia Rosaline SQUIBB, NP      HPI: Patient is a pleasant 43 year old woman with a history of bilateral knee arthritis.  She comes in periodically for injections.  She is working with her physician on weight loss..  She is using [facility]  Assessment & Plan: Visit Diagnoses:  1. Chronic pain of both knees     Plan: Went forward with injections today may follow-up as needed  Follow-Up Instructions: No follow-ups on file.   Ortho Exam  Patient is alert, oriented, no adenopathy, well-dressed, normal affect, normal respiratory effort. Examined patient of her knees no erythema no effusion compartments are soft and compressible she is neurovascular intact    Imaging: No results found. No images are attached to the encounter.  Labs: Lab Results  Component Value Date   HGBA1C 6.1 10/03/2023   HGBA1C 6.0 02/04/2021     Lab Results  Component Value Date   ALBUMIN 4.0 10/03/2023   ALBUMIN 4.3 02/04/2021   ALBUMIN 3.8 09/08/2020    No results found for: MG No results found for: VD25OH  No results found for: PREALBUMIN    Latest Ref Rng & Units 10/03/2023    4:48 PM 11/12/2021    8:04 AM 02/04/2021   11:19 AM  CBC EXTENDED  WBC 3.4 - 10.8 x10E3/uL 9.1  8.4  8.1   RBC 3.77 - 5.28 x10E6/uL 4.81  4.24  4.64   Hemoglobin 11.1 - 15.9 g/dL 86.1  87.5  86.8   HCT 34.0 - 46.6 % 42.1  37.7  40.6   Platelets 150 - 450 x10E3/uL 227  189  223   NEUT# 1.4 - 7.0 x10E3/uL 5.7   4.4   Lymph# 0.7 - 3.1 x10E3/uL 2.7   2.8      There is no height or weight on file to calculate BMI.  Orders:  No orders of the defined types were placed in this encounter.  No orders of the defined types were placed in this encounter.    Procedures: Large  Joint Inj: bilateral knee on 12/11/2023 2:22 PM Indications: pain and diagnostic evaluation Details: 25 G 1.5 in needle, anteromedial approach  Arthrogram: No  Medications (Right): 4 mL lidocaine  1 %; 40 mg methylPREDNISolone  acetate 40 MG/ML Medications (Left): 4 mL lidocaine  1 %; 40 mg methylPREDNISolone  acetate 40 MG/ML Outcome: tolerated well, no immediate complications Procedure, treatment alternatives, risks and benefits explained, specific risks discussed. Consent was given by the patient. Immediately prior to procedure a time out was called to verify the correct patient, procedure, equipment, support staff and site/side marked as required. Patient was prepped and draped in the usual sterile fashion.     Clinical Data: No additional findings.  ROS:  All other systems negative, except as noted in the HPI. Review of Systems  Objective: Vital Signs: There were no vitals taken for this visit.  Specialty Comments:  No specialty comments available.  PMFS History: Patient Active Problem List   Diagnosis Date Noted  . Bilateral knee pain 09/13/2023  . Chronic right shoulder pain 01/04/2021  . Numbness and tingling in right hand  01/04/2021  . Essential hypertension 01/04/2021   Past Medical History:  Diagnosis Date  . Hypertension   . Medical history non-contributory     Family History  Problem Relation Age of Onset  . Breast cancer Maternal Aunt     Past Surgical History:  Procedure Laterality Date  . CESAREAN SECTION     Social History   Occupational History  . Not on file  Tobacco Use  . Smoking status: Former    Current packs/day: 0.00    Types: Cigarettes    Quit date: 07/27/2021    Years since quitting: 2.3  . Smokeless tobacco: Never  Vaping Use  . Vaping status: Every Day  Substance and Sexual Activity  . Alcohol use: Yes    Comment: occasionally  . Drug use: No  . Sexual activity: Yes    Birth control/protection: Inserts

## 2023-12-12 ENCOUNTER — Encounter (INDEPENDENT_AMBULATORY_CARE_PROVIDER_SITE_OTHER): Payer: Self-pay | Admitting: Family Medicine

## 2023-12-12 ENCOUNTER — Ambulatory Visit (INDEPENDENT_AMBULATORY_CARE_PROVIDER_SITE_OTHER): Admitting: Family Medicine

## 2023-12-12 VITALS — BP 136/76 | HR 92 | Temp 98.4°F | Ht 70.5 in | Wt >= 6400 oz

## 2023-12-12 DIAGNOSIS — R739 Hyperglycemia, unspecified: Secondary | ICD-10-CM

## 2023-12-12 DIAGNOSIS — M25561 Pain in right knee: Secondary | ICD-10-CM | POA: Diagnosis not present

## 2023-12-12 DIAGNOSIS — I119 Hypertensive heart disease without heart failure: Secondary | ICD-10-CM | POA: Diagnosis not present

## 2023-12-12 DIAGNOSIS — E66813 Obesity, class 3: Secondary | ICD-10-CM | POA: Diagnosis not present

## 2023-12-12 DIAGNOSIS — Z6841 Body Mass Index (BMI) 40.0 and over, adult: Secondary | ICD-10-CM

## 2023-12-12 DIAGNOSIS — I10 Essential (primary) hypertension: Secondary | ICD-10-CM | POA: Diagnosis not present

## 2023-12-12 DIAGNOSIS — E669 Obesity, unspecified: Secondary | ICD-10-CM | POA: Diagnosis not present

## 2023-12-12 DIAGNOSIS — M25562 Pain in left knee: Secondary | ICD-10-CM | POA: Diagnosis not present

## 2023-12-12 DIAGNOSIS — R7303 Prediabetes: Secondary | ICD-10-CM | POA: Diagnosis not present

## 2023-12-12 DIAGNOSIS — R5383 Other fatigue: Secondary | ICD-10-CM | POA: Diagnosis not present

## 2023-12-12 DIAGNOSIS — Z0289 Encounter for other administrative examinations: Secondary | ICD-10-CM

## 2023-12-12 NOTE — Progress Notes (Signed)
 Office: [phone]  /  Fax: [phone]  WEIGHT SUMMARY AND BIOMETRICS  Anthropometric Measurements Height: 5' 10.5 (1.791 m) Weight: (!) 409 lb (185.5 kg) BMI (Calculated): 57.84 Weight at Last Visit: NA Weight Lost Since Last Visit: NA Weight Gained Since Last Visit: NA Starting Weight: NA Total Weight Loss (lbs): -- (NA) Peak Weight: 409lbs Waist Measurement : -- (NA)   Body Composition  Body Fat %: 60.8 % Fat Mass (lbs): 248.6 lbs Muscle Mass (lbs): 152.4 lbs Visceral Fat Rating : 25   Other Clinical Data A1c: -- (NA) RMR: -- (NA) Fasting: no Labs: no Today's Visit #: Consult Starting Date: -- (NA) Comments: Consult    Chief Complaint: OBESITY    History of Present Illness Jill Stevenson is a 43 year old female who presents for a consultation on her obesity and related comorbidities.  She has struggled with her weight for years, with significant challenges beginning after high school. She experiences excessive hunger, particularly at night, leading to the consumption of snacks and candy, which she identifies as her weakness. Despite not having a large appetite during the day, she finds herself eating the wrong things at night.  She has attempted weight loss through exercise and intermittent fasting. She was consistent with working out until the COVID-19 pandemic disrupted her routine, as she preferred exercising late at night when fewer people were around. Her weight has remained stable at 409 pounds, fluctuating slightly between 409 and 410 pounds, which she finds frustrating as it is not even going up or down.  She has tried intermittent fasting, but her weight remains stable. She notes that her weight has been up and down but currently remains at 409 pounds.      PHYSICAL EXAM:  Blood pressure 136/76, pulse 92, temperature 98.4 F (36.9 C), height 5' 10.5 (1.791 m), weight (!) 409 lb (185.5 kg), last menstrual period 11/14/2023, SpO2  96%. Body mass index is 57.86 kg/m.  DIAGNOSTIC DATA REVIEWED:  BMET    Component Value Date/Time   NA 141 10/03/2023 1648   K 3.9 10/03/2023 1648   CL 102 10/03/2023 1648   CO2 22 10/03/2023 1648   GLUCOSE 87 10/03/2023 1648   GLUCOSE 122 (H) 11/12/2021 0804   BUN 18 10/03/2023 1648   CREATININE 0.96 10/03/2023 1648   CALCIUM  8.9 10/03/2023 1648   GFRNONAA >60 11/12/2021 0804   GFRAA 90 03/27/2019 1110   Lab Results  Component Value Date   HGBA1C 6.1 10/03/2023   HGBA1C 6.0 02/04/2021   No results found for: INSULIN No results found for: TSH CBC    Component Value Date/Time   WBC 9.1 10/03/2023 1648   WBC 8.4 11/12/2021 0804   RBC 4.81 10/03/2023 1648   RBC 4.24 11/12/2021 0804   HGB 13.8 10/03/2023 1648   HCT 42.1 10/03/2023 1648   PLT 227 10/03/2023 1648   MCV 88 10/03/2023 1648   MCH 28.7 10/03/2023 1648   MCH 29.2 11/12/2021 0804   MCHC 32.8 10/03/2023 1648   MCHC 32.9 11/12/2021 0804   RDW 13.9 10/03/2023 1648   Iron Studies No results found for: IRON, TIBC, FERRITIN, IRONPCTSAT Lipid Panel     Component Value Date/Time   CHOL 202 (H) 10/03/2023 1648   TRIG 83 10/03/2023 1648   HDL 78 10/03/2023 1648   CHOLHDL 2.6 10/03/2023 1648   LDLCALC 109 (H) 10/03/2023 1648   Hepatic Function Panel     Component Value Date/Time   PROT 7.6 10/03/2023  1648   ALBUMIN 4.0 10/03/2023 1648   AST 15 10/03/2023 1648   ALT 26 10/03/2023 1648   ALKPHOS 97 10/03/2023 1648   BILITOT 0.2 10/03/2023 1648   No results found for: TSH Nutritional No results found for: VD25OH   Assessment and Plan Assessment & Plan Obesity Chronic obesity with a BMI of 57.84, body fat percentage of 60.8%, and visceral fat rating of 25. Exacerbated by excessive hunger and nighttime snacking. Previous weight loss attempts, including intermittent fasting and exercise, were disrupted by the COVID-19 pandemic. Treatment focuses on obesity as a disease, emphasizing overall  health improvement. A comprehensive workup will tailor interventions to her needs, recognizing individual responses to dietary plans. Medications will be considered based on benefits and risks, aiming for healthy weight loss and overall health improvement. - Conduct comprehensive workup including EKG, labs, and metabolism test - Provide initial eating plan based on current information, refine after testing results - Discuss potential use of medications for weight management, considering pros and cons - Schedule follow-up visits every two weeks for the first six visits, then every three to four weeks  Hypertension Blood pressure at 136/76, indicating hypertension. Considered a comorbidity related to obesity. Weight management and lifestyle modifications are expected to positively impact blood pressure control. - Address hypertension through weight management and lifestyle modifications  Hyperglycemia Hyperglycemia as a comorbidity related to obesity. Treatment will address this condition alongside obesity management. Weight management and lifestyle modifications are expected to improve glycemic control. - Address hyperglycemia through weight management and lifestyle modifications -Check labs at the next work-up visit     I have personally spent 30 minutes total time today in preparation, patient care, and documentation for this visit, including the following: review of clinical lab tests; review of medical history, review of obesity history and nutritional history, review of Obesity pathophysiology and treatment options.  She was informed of the importance of frequent follow up visits to maximize her success with intensive lifestyle modifications for her multiple health conditions.    Louann Penton, MD

## 2023-12-14 ENCOUNTER — Other Ambulatory Visit: Payer: Self-pay | Admitting: Primary Care

## 2023-12-14 DIAGNOSIS — Z1231 Encounter for screening mammogram for malignant neoplasm of breast: Secondary | ICD-10-CM

## 2023-12-20 ENCOUNTER — Ambulatory Visit
Admission: RE | Admit: 2023-12-20 | Discharge: 2023-12-20 | Disposition: A | Discharge status: Home / Self Care | Payer: Self-pay | Source: Ambulatory Visit

## 2023-12-20 DIAGNOSIS — Z1231 Encounter for screening mammogram for malignant neoplasm of breast: Secondary | ICD-10-CM

## 2023-12-26 ENCOUNTER — Other Ambulatory Visit: Payer: Self-pay | Admitting: Primary Care

## 2023-12-26 DIAGNOSIS — R928 Other abnormal and inconclusive findings on diagnostic imaging of breast: Secondary | ICD-10-CM

## 2023-12-27 ENCOUNTER — Other Ambulatory Visit: Payer: Self-pay | Admitting: Orthopedic Surgery

## 2023-12-27 DIAGNOSIS — G8918 Other acute postprocedural pain: Secondary | ICD-10-CM | POA: Insufficient documentation

## 2023-12-27 DIAGNOSIS — R2232 Localized swelling, mass and lump, left upper limb: Secondary | ICD-10-CM | POA: Diagnosis not present

## 2023-12-28 ENCOUNTER — Ambulatory Visit
Admission: RE | Admit: 2023-12-28 | Discharge: 2023-12-28 | Disposition: A | Discharge status: Home / Self Care | Payer: Self-pay | Source: Ambulatory Visit | Attending: Primary Care | Admitting: Primary Care

## 2023-12-28 DIAGNOSIS — R928 Other abnormal and inconclusive findings on diagnostic imaging of breast: Secondary | ICD-10-CM

## 2023-12-28 LAB — SURGICAL PATHOLOGY

## 2024-01-07 ENCOUNTER — Ambulatory Visit (INDEPENDENT_AMBULATORY_CARE_PROVIDER_SITE_OTHER): Payer: Self-pay | Admitting: Primary Care

## 2024-01-07 ENCOUNTER — Encounter (INDEPENDENT_AMBULATORY_CARE_PROVIDER_SITE_OTHER): Payer: Self-pay | Admitting: Primary Care

## 2024-01-07 VITALS — BP 132/92 | HR 86 | Wt >= 6400 oz

## 2024-01-07 DIAGNOSIS — Z6841 Body Mass Index (BMI) 40.0 and over, adult: Secondary | ICD-10-CM

## 2024-01-07 DIAGNOSIS — Z7689 Persons encountering health services in other specified circumstances: Secondary | ICD-10-CM

## 2024-01-07 DIAGNOSIS — M79645 Pain in left finger(s): Secondary | ICD-10-CM

## 2024-01-07 DIAGNOSIS — I10 Essential (primary) hypertension: Secondary | ICD-10-CM

## 2024-01-07 MED ORDER — AMLODIPINE BESYLATE 5 MG PO TABS
5.0000 mg | ORAL_TABLET | Freq: Every day | ORAL | 1 refills | Status: DC
Start: 2024-01-07 — End: 2024-03-11

## 2024-01-07 MED ORDER — HYDROCHLOROTHIAZIDE 25 MG PO TABS: 25.0000 mg | ORAL_TABLET | Freq: Every day | ORAL | 1 refills | Status: AC

## 2024-01-07 NOTE — Progress Notes (Addendum)
 [facility]  Jill Stevenson, is a 43 y.o. female  RDW:[id number]  FMW:[id number]  DOB - [birth year]  Chief Complaint  Patient presents with   Medical Management of Chronic Issues    Had surgery on finger- still having pain.       Subjective:   Jill Stevenson is a 43 y.o. female here today for an acute visit.  Initially for pain in [facility] on left hand she had orthopedic surgery by Dr. Lynwood Rias and has a follow-up appointment scheduled on the August 13th at 1210 will refer that to Ortho She is followed by weight management Dr. Wanda Saint has an upcoming appointment.  August 13 at 2 PM Today we will discuss her elevated blood pressure h she was currently prescribed HCTZ 50 mg outside of practice.  Reevaluate blood pressure and medication changes  No problems updated.  Comprehensive ROS Pertinent positive and negative noted in HPI   No Known Allergies  Past Medical History:  Diagnosis Date   Hypertension    Medical history non-contributory     Current Outpatient Medications on File Prior to Visit  Medication Sig Dispense Refill   buPROPion (WELLBUTRIN XL) 150 MG 24 hr tablet Take 150 mg by mouth daily.     WEGOVY 0.5 MG/0.5ML SOAJ ADMINISTER 0.5 MG UNDER THE SKIN WEEKLY     No current facility-administered medications on file prior to visit.   Health Maintenance  Topic Date Due   Hepatitis B Vaccine (1 of 3 - 19+ 3-dose series) Never done   HPV Vaccine (1 - 3-dose SCDM series) Never done   COVID-19 Vaccine (1 - 2024-25 season) Never done   Pap with HPV screening  01/02/2024   Flu Shot  12/28/2023   DTaP/Tdap/Td vaccine (3 - Td or Tdap) 06/24/2024   Hepatitis C Screening  Completed   HIV Screening  Completed   Meningitis B Vaccine  Aged Out    Objective:   Vitals:   01/07/24 1133  BP: (!) 132/92  Pulse: 86  SpO2: 100%  Weight: (!) 402 lb 9.6 oz (182.6 kg)   BP Readings from Last 3 Encounters:  01/07/24 (!) 132/92  12/12/23 136/76  10/03/23  (!) 135/95      Physical Exam Constitutional:      Appearance: She is obese.     Comments: Severe   HENT:     Right Ear: Tympanic membrane and external ear normal.     Left Ear: Tympanic membrane and external ear normal.     Ears:     Comments: Red cannal    Nose: Nose normal.  Eyes:     Extraocular Movements: Extraocular movements intact.     Pupils: Pupils are equal, round, and reactive to light.  Cardiovascular:     Rate and Rhythm: Normal rate and regular rhythm.  Pulmonary:     Effort: Pulmonary effort is normal.     Breath sounds: Normal breath sounds.  Abdominal:     General: Bowel sounds are normal. There is distension.     Palpations: Abdomen is soft.  Musculoskeletal:     Cervical back: Normal range of motion and neck supple.     Comments: Left hand ring finger painful and swollen difficulty using hand   Neurological:     Mental Status: She is alert and oriented to person, place, and time.  Psychiatric:        Mood and Affect: Mood normal.        Behavior: Behavior normal.  Assessment & Plan  Dima Mini was seen today for medical management of chronic issues.  Diagnoses and all orders for this visit:  Essential hypertension BP goal - < 130/80 Explained that having normal blood pressure is the goal and medications are helping to get to goal and maintain normal blood pressure. DIET: Limit salt intake, read nutrition labels to check salt content, limit fried and high fatty foods  Avoid using multisymptom OTC cold preparations that generally contain sudafed which can rise BP. Consult with pharmacist on best cold relief products to use for persons with HTN EXERCISE Discussed incorporating exercise such as walking - 30 minutes most days of the week and can do in 10 minute intervals    Blood pressure medication changed to hydrochlorothiazide  25 mg daily with amlodipine  5 mg daily 25 blood pressure check  BMI 50.0-59.9, adult (HCC) 2/2 Seen in weight  management clinic Currently on Wegovy In the last month she has lost 7 pounds  Finger pain, left Refer to Ortho  Patient have been counseled extensively about nutrition and exercise. Other issues discussed during this visit include: low cholesterol diet, weight control and daily exercise, foot care, annual eye examinations at Ophthalmology, importance of adherence with medications and regular follow-up. We also discussed long term complications of uncontrolled diabetes and hypertension.   Return in about 4 weeks (around 02/04/2024) for re-check blood pressure.  The patient was given clear instructions to go to ER or return to medical center if symptoms don't improve, worsen or new problems develop. The patient verbalized understanding. The patient was told to call to get lab results if they haven't heard anything in the next week.   This note has been created with Education officer, environmental. Any transcriptional errors are unintentional.   Jill SHAUNNA Bohr, NP 01/07/2024, 12:14 PM

## 2024-01-09 DIAGNOSIS — K5903 Drug induced constipation: Secondary | ICD-10-CM | POA: Diagnosis not present

## 2024-01-09 DIAGNOSIS — R7303 Prediabetes: Secondary | ICD-10-CM | POA: Diagnosis not present

## 2024-01-09 DIAGNOSIS — I119 Hypertensive heart disease without heart failure: Secondary | ICD-10-CM | POA: Diagnosis not present

## 2024-01-09 DIAGNOSIS — M25562 Pain in left knee: Secondary | ICD-10-CM | POA: Diagnosis not present

## 2024-01-09 DIAGNOSIS — E66813 Obesity, class 3: Secondary | ICD-10-CM | POA: Diagnosis not present

## 2024-02-06 DIAGNOSIS — M25562 Pain in left knee: Secondary | ICD-10-CM | POA: Diagnosis not present

## 2024-02-06 DIAGNOSIS — I119 Hypertensive heart disease without heart failure: Secondary | ICD-10-CM | POA: Diagnosis not present

## 2024-02-06 DIAGNOSIS — F4321 Adjustment disorder with depressed mood: Secondary | ICD-10-CM | POA: Diagnosis not present

## 2024-02-06 DIAGNOSIS — E66813 Obesity, class 3: Secondary | ICD-10-CM | POA: Diagnosis not present

## 2024-02-06 DIAGNOSIS — R7303 Prediabetes: Secondary | ICD-10-CM | POA: Diagnosis not present

## 2024-02-20 ENCOUNTER — Encounter (INDEPENDENT_AMBULATORY_CARE_PROVIDER_SITE_OTHER): Payer: Self-pay

## 2024-02-26 ENCOUNTER — Encounter (INDEPENDENT_AMBULATORY_CARE_PROVIDER_SITE_OTHER): Payer: Self-pay | Admitting: Family Medicine

## 2024-02-26 ENCOUNTER — Ambulatory Visit (INDEPENDENT_AMBULATORY_CARE_PROVIDER_SITE_OTHER): Admitting: Family Medicine

## 2024-02-26 VITALS — BP 123/83 | HR 102 | Temp 97.8°F | Ht 70.5 in | Wt >= 6400 oz

## 2024-02-26 DIAGNOSIS — E785 Hyperlipidemia, unspecified: Secondary | ICD-10-CM

## 2024-02-26 DIAGNOSIS — R0609 Other forms of dyspnea: Secondary | ICD-10-CM

## 2024-02-26 DIAGNOSIS — R5383 Other fatigue: Secondary | ICD-10-CM | POA: Diagnosis not present

## 2024-02-26 DIAGNOSIS — Z6841 Body Mass Index (BMI) 40.0 and over, adult: Secondary | ICD-10-CM | POA: Diagnosis not present

## 2024-02-26 DIAGNOSIS — I10 Essential (primary) hypertension: Secondary | ICD-10-CM

## 2024-02-26 DIAGNOSIS — R7303 Prediabetes: Secondary | ICD-10-CM

## 2024-02-26 DIAGNOSIS — E669 Obesity, unspecified: Secondary | ICD-10-CM | POA: Diagnosis not present

## 2024-02-26 DIAGNOSIS — Z1331 Encounter for screening for depression: Secondary | ICD-10-CM

## 2024-02-26 NOTE — Progress Notes (Signed)
 Office: [phone]  /  Fax: [phone]  WEIGHT SUMMARY AND BIOMETRICS  Anthropometric Measurements Height: 5' 10.5 (1.791 m) Weight: (!) 404 lb (183.3 kg) BMI (Calculated): 57.13 Starting Weight: 404 lb Peak Weight: 415 lb Waist Measurement : 58 inches   Body Composition  Body Fat %: 61.1 % Fat Mass (lbs): 247.4 lbs Muscle Mass (lbs): 149.4 lbs Visceral Fat Rating : 25   Other Clinical Data RMR: 1858 Fasting: yes Labs: yes Today's Visit #: 1 Starting Date: 02/26/24 Comments: First Visit    Chief Complaint: OBESITY    History of Present Illness Jill Stevenson is a 43 year old female with obesity who presents for a workup to determine the most ideal treatment plan. She was referred by Dr. Medford Marina for evaluation and management of obesity.  She has been on Wegovy 0.5 mg weekly since June, prescribed by her wellness doctor. She is interested in finding a nutrition plan that works for her. She has tried intermittent fasting, specifically a 16:8 schedule, and often skips breakfast. She enjoys raw vegetables and fruits and identifies emotional eating patterns, particularly nighttime and reward eating.  She has hypertension, controlled with amlodipine  5 mg and hydrochlorothiazide  25 mg. She also has prediabetes with a recent A1c of 6.1% and is not on medication for this condition. Hyperlipidemia is present but untreated.  She experiences fatigue, which may be related to her weight, and shortness of breath with exercise. She wakes up frequently during the night, affecting her sleep quality. She lives alone, which she finds makes meal planning easier, and aims for a goal weight of 200 pounds. She gets about six hours of sleep per night, sometimes waking refreshed and other times tired.  Testing today includes:  Basal Metabolic Rate calculated via Bioimpedance scale 2432 Resting Energy Expenditure measured via Indirect Calorimeter 1858 and is lower  than  expected. PHQ-9 score was wnl at 9 Epworth Sleepiness Score was WNL at 7 ECG NSR 91 BPM       PHYSICAL EXAM:  Blood pressure 123/83, pulse (!) 102, temperature 97.8 F (36.6 C), height 5' 10.5 (1.791 m), weight (!) 404 lb (183.3 kg), SpO2 95%. Body mass index is 57.15 kg/m.  DIAGNOSTIC DATA REVIEWED:  BMET    Component Value Date/Time   NA 141 10/03/2023 1648   K 3.9 10/03/2023 1648   CL 102 10/03/2023 1648   CO2 22 10/03/2023 1648   GLUCOSE 87 10/03/2023 1648   GLUCOSE 122 (H) 11/12/2021 0804   BUN 18 10/03/2023 1648   CREATININE 0.96 10/03/2023 1648   CALCIUM  8.9 10/03/2023 1648   GFRNONAA >60 11/12/2021 0804   GFRAA 90 03/27/2019 1110   Lab Results  Component Value Date   HGBA1C 6.1 10/03/2023   HGBA1C 6.0 02/04/2021   No results found for: INSULIN No results found for: TSH CBC    Component Value Date/Time   WBC 9.1 10/03/2023 1648   WBC 8.4 11/12/2021 0804   RBC 4.81 10/03/2023 1648   RBC 4.24 11/12/2021 0804   HGB 13.8 10/03/2023 1648   HCT 42.1 10/03/2023 1648   PLT 227 10/03/2023 1648   MCV 88 10/03/2023 1648   MCH 28.7 10/03/2023 1648   MCH 29.2 11/12/2021 0804   MCHC 32.8 10/03/2023 1648   MCHC 32.9 11/12/2021 0804   RDW 13.9 10/03/2023 1648   Iron Studies No results found for: IRON, TIBC, FERRITIN, IRONPCTSAT Lipid Panel     Component Value Date/Time   CHOL 202 (H) 10/03/2023  1648   TRIG 83 10/03/2023 1648   HDL 78 10/03/2023 1648   CHOLHDL 2.6 10/03/2023 1648   LDLCALC 109 (H) 10/03/2023 1648   Hepatic Function Panel     Component Value Date/Time   PROT 7.6 10/03/2023 1648   ALBUMIN 4.0 10/03/2023 1648   AST 15 10/03/2023 1648   ALT 26 10/03/2023 1648   ALKPHOS 97 10/03/2023 1648   BILITOT 0.2 10/03/2023 1648   No results found for: TSH Nutritional No results found for: VD25OH   Assessment and Plan Assessment & Plan Obesity Obesity management is the primary focus. She is on Wegovy 0.5 mg weekly. A  comprehensive workup is planned to assess her response to weight loss efforts. She has a specific eating plan to prevent resistance to weight loss, including regular foods tailored to her needs. She has considered weight loss surgery but is not pursuing it. The goal weight is 200 pounds, focusing on health and well-being. - Provide category three eating plan with grocery list - Instruct on use of food scale for portion control - Advise against weighing herself between visits - Review test results at next visit - Schedule follow-up appointment in two weeks  Essential hypertension Hypertension is well-controlled with a blood pressure of 123/83 mmHg on amlodipine  5 mg and hydrochlorothiazide  25 mg. - Check labs - Continue diet, exercise and weight loss as discussed today as an important part of the treatment plan   Prediabetes Prediabetes with a recent A1c of 6.1% as of May. This will be considered in the overall management plan, particularly in relation to weight loss and dietary changes. - Check labs - Continue diet, exercise and weight loss as discussed today as an important part of the treatment plan   Hyperlipidemia Hyperlipidemia is currently untreated and will be monitored as part of the comprehensive weight management plan. - Check labs - Continue diet, exercise and weight loss as discussed today as an important part of the treatment plan   Fatigue Reports fatigue, possibly related to weight. Nutritional adjustments will be the initial focus, with further investigation if fatigue persists. -Check labs and follow  Dyspnea on exertion Experiences dyspnea on exertion, possibly due to exercise intolerance. Initial focus on nutritional management with gradual introduction of exercise. Further evaluation if symptoms persist.     I personally spent a total of 41 minutes in the care of the patient today including preparing to see the patient, reviewing separately obtained history,  placing orders in the EMR, documenting clinical information in the EMR, customized nutritional counseling for their specific health and social needs, and explaining the pathophysiology of obesity and how it is significantly more complex than eat less and exercise more.    Jill Stevenson was informed of the importance of frequent follow up visits to maximize her success with intensive lifestyle modifications for her obesity and obesity related health conditions as recommended by USPSTF and CMS guidelines   Louann Penton, MD

## 2024-02-27 DIAGNOSIS — E66813 Obesity, class 3: Secondary | ICD-10-CM | POA: Diagnosis not present

## 2024-02-27 DIAGNOSIS — R7303 Prediabetes: Secondary | ICD-10-CM | POA: Diagnosis not present

## 2024-02-27 DIAGNOSIS — I119 Hypertensive heart disease without heart failure: Secondary | ICD-10-CM | POA: Diagnosis not present

## 2024-02-27 DIAGNOSIS — M25562 Pain in left knee: Secondary | ICD-10-CM | POA: Diagnosis not present

## 2024-02-27 DIAGNOSIS — G4709 Other insomnia: Secondary | ICD-10-CM | POA: Diagnosis not present

## 2024-02-27 DIAGNOSIS — R0683 Snoring: Secondary | ICD-10-CM | POA: Diagnosis not present

## 2024-02-27 DIAGNOSIS — F4321 Adjustment disorder with depressed mood: Secondary | ICD-10-CM | POA: Diagnosis not present

## 2024-02-28 LAB — CBC WITH DIFFERENTIAL/PLATELET
Basophils Absolute: 0 x10E3/uL (ref 0.0–0.2)
Basos: 0 %
EOS (ABSOLUTE): 0.1 x10E3/uL (ref 0.0–0.4)
Eos: 1 %
Hematocrit: 39.5 % (ref 34.0–46.6)
Hemoglobin: 12.8 g/dL (ref 11.1–15.9)
Immature Grans (Abs): 0 x10E3/uL (ref 0.0–0.1)
Immature Granulocytes: 0 %
Lymphocytes Absolute: 2 x10E3/uL (ref 0.7–3.1)
Lymphs: 29 %
MCH: 29.1 pg (ref 26.6–33.0)
MCHC: 32.4 g/dL (ref 31.5–35.7)
MCV: 90 fL (ref 79–97)
Monocytes Absolute: 0.5 x10E3/uL (ref 0.1–0.9)
Monocytes: 7 %
Neutrophils Absolute: 4.2 x10E3/uL (ref 1.4–7.0)
Neutrophils: 63 %
Platelets: 224 x10E3/uL (ref 150–450)
RBC: 4.4 x10E6/uL (ref 3.77–5.28)
RDW: 14.1 % (ref 11.7–15.4)
WBC: 6.7 x10E3/uL (ref 3.4–10.8)

## 2024-02-28 LAB — CMP14+EGFR
ALT: 25 IU/L (ref 0–32)
AST: 20 IU/L (ref 0–40)
Albumin: 4 g/dL (ref 3.9–4.9)
Alkaline Phosphatase: 90 IU/L (ref 41–116)
BUN/Creatinine Ratio: 15 (ref 9–23)
BUN: 13 mg/dL (ref 6–24)
Bilirubin Total: 0.4 mg/dL (ref 0.0–1.2)
CO2: 25 mmol/L (ref 20–29)
Calcium: 9.1 mg/dL (ref 8.7–10.2)
Chloride: 98 mmol/L (ref 96–106)
Creatinine, Ser: 0.89 mg/dL (ref 0.57–1.00)
Globulin, Total: 3.4 g/dL (ref 1.5–4.5)
Glucose: 89 mg/dL (ref 70–99)
Potassium: 3.3 mmol/L — ABNORMAL LOW (ref 3.5–5.2)
Sodium: 139 mmol/L (ref 134–144)
Total Protein: 7.4 g/dL (ref 6.0–8.5)
eGFR: 82 mL/min/1.73 (ref >59)

## 2024-02-28 LAB — LIPID PANEL WITH LDL/HDL RATIO
Cholesterol, Total: 198 mg/dL (ref 100–199)
HDL: 73 mg/dL (ref >39)
LDL Chol Calc (NIH): 104 mg/dL — ABNORMAL HIGH (ref 0–99)
LDL/HDL Ratio: 1.4 ratio (ref 0.0–3.2)
Triglycerides: 119 mg/dL (ref 0–149)
VLDL Cholesterol Cal: 21 mg/dL (ref 5–40)

## 2024-02-28 LAB — HEMOGLOBIN A1C
Est. average glucose Bld gHb Est-mCnc: 123 mg/dL
Hgb A1c MFr Bld: 5.9 % — ABNORMAL HIGH (ref 4.8–5.6)

## 2024-02-28 LAB — FOLATE: Folate: 10.1 ng/mL (ref >3.0)

## 2024-02-28 LAB — TSH: TSH: 3.02 u[IU]/mL (ref 0.450–4.500)

## 2024-02-28 LAB — VITAMIN B12: Vitamin B-12: >2000 pg/mL — ABNORMAL HIGH (ref 232–1245)

## 2024-02-28 LAB — VITAMIN D 25 HYDROXY (VIT D DEFICIENCY, FRACTURES): Vit D, 25-Hydroxy: 10 ng/mL — ABNORMAL LOW (ref 30.0–100.0)

## 2024-02-28 LAB — INSULIN, RANDOM: INSULIN: 25.2 u[IU]/mL — AB (ref 2.6–24.9)

## 2024-03-04 DIAGNOSIS — R7303 Prediabetes: Secondary | ICD-10-CM | POA: Diagnosis not present

## 2024-03-04 DIAGNOSIS — E782 Mixed hyperlipidemia: Secondary | ICD-10-CM | POA: Diagnosis not present

## 2024-03-04 DIAGNOSIS — M25562 Pain in left knee: Secondary | ICD-10-CM | POA: Diagnosis not present

## 2024-03-04 DIAGNOSIS — F4321 Adjustment disorder with depressed mood: Secondary | ICD-10-CM | POA: Diagnosis not present

## 2024-03-04 DIAGNOSIS — E559 Vitamin D deficiency, unspecified: Secondary | ICD-10-CM | POA: Diagnosis not present

## 2024-03-04 DIAGNOSIS — M25561 Pain in right knee: Secondary | ICD-10-CM | POA: Diagnosis not present

## 2024-03-04 DIAGNOSIS — I119 Hypertensive heart disease without heart failure: Secondary | ICD-10-CM | POA: Diagnosis not present

## 2024-03-04 DIAGNOSIS — G4709 Other insomnia: Secondary | ICD-10-CM | POA: Diagnosis not present

## 2024-03-11 ENCOUNTER — Other Ambulatory Visit (INDEPENDENT_AMBULATORY_CARE_PROVIDER_SITE_OTHER): Payer: Self-pay | Admitting: Family Medicine

## 2024-03-11 ENCOUNTER — Encounter (INDEPENDENT_AMBULATORY_CARE_PROVIDER_SITE_OTHER): Payer: Self-pay | Admitting: Family Medicine

## 2024-03-11 ENCOUNTER — Ambulatory Visit (INDEPENDENT_AMBULATORY_CARE_PROVIDER_SITE_OTHER): Admitting: Family Medicine

## 2024-03-11 VITALS — BP 128/76 | HR 101 | Temp 98.6°F | Ht 70.5 in | Wt >= 6400 oz

## 2024-03-11 DIAGNOSIS — E876 Hypokalemia: Secondary | ICD-10-CM | POA: Diagnosis not present

## 2024-03-11 DIAGNOSIS — Z6841 Body Mass Index (BMI) 40.0 and over, adult: Secondary | ICD-10-CM | POA: Diagnosis not present

## 2024-03-11 DIAGNOSIS — E7800 Pure hypercholesterolemia, unspecified: Secondary | ICD-10-CM | POA: Diagnosis not present

## 2024-03-11 DIAGNOSIS — R7303 Prediabetes: Secondary | ICD-10-CM

## 2024-03-11 DIAGNOSIS — E559 Vitamin D deficiency, unspecified: Secondary | ICD-10-CM | POA: Diagnosis not present

## 2024-03-11 DIAGNOSIS — I10 Essential (primary) hypertension: Secondary | ICD-10-CM

## 2024-03-11 MED ORDER — METFORMIN HCL 500 MG PO TABS: 500.0000 mg | ORAL_TABLET | Freq: Every day | ORAL | 0 refills | Status: AC

## 2024-03-11 MED ORDER — VITAMIN D (ERGOCALCIFEROL) 1.25 MG (50000 UNIT) PO CAPS: 50000.0000 [IU] | ORAL_CAPSULE | ORAL | 0 refills | Status: AC

## 2024-03-11 MED ORDER — AMLODIPINE BESYLATE 10 MG PO TABS: 10.0000 mg | ORAL_TABLET | Freq: Every day | ORAL | 0 refills | Status: AC

## 2024-03-11 NOTE — Progress Notes (Signed)
 Office: [phone]  /  Fax: [phone]  WEIGHT SUMMARY AND BIOMETRICS  Anthropometric Measurements Height: 5' 10.5 (1.791 m) Weight: (!) 405 lb (183.7 kg) BMI (Calculated): 57.27 Weight at Last Visit: 404lb Weight Lost Since Last Visit: 0lb Weight Gained Since Last Visit: 1lb Starting Weight: 404lb Total Weight Loss (lbs): 0 lb (0 kg) Peak Weight: 415lb Waist Measurement : 58 inches   Body Composition  Body Fat %: 61.3 % Fat Mass (lbs): 248.2 lbs Muscle Mass (lbs): 149 lbs Visceral Fat Rating : 25   Other Clinical Data RMR: [id number] Fasting: No Labs: No Today's Visit #: 2 Starting Date: 02/26/24    Chief Complaint: OBESITY   History of Present Illness Jill Stevenson is a 43 year old female who presents for a follow-up on her obesity treatment plan.  She has not adhered to the prescribed category three eating plan, reporting zero percent adherence, and has gained one pound in the last two weeks. She experiences difficulty with the eating plan due to a lack of appetite, stating she is not a breakfast person and typically drinks coffee in the morning, sometimes with two boiled eggs. Her busy work schedule leads to increased hunger at night, resulting in her consuming water, ice, or frozen grapes before bed. During the day, she eats canned tuna but has to force herself due to her lack of appetite. She tries to avoid unhealthy foods but struggles with meal planning.  Her potassium levels have been low in the past, and she recalls having required potassium supplementation.  Her cholesterol levels have improved, with an LDL of 104. However, her vitamin D  levels are very low at 10, and she feels tired and sluggish. Her B12 levels are high, likely due to supplementation, and her folic acid levels are normal.  Her fasting glucose is normal, but her A1c is 5.9, indicating prediabetes. Her insulin levels are elevated at 25. She experiences more hunger at night, which  aligns with her insulin levels being higher at the end of the day.  She is currently taking amlodipine  5 mg and hydrochlorothiazide . She was previously prescribed Wegovy but was unable to obtain it any longer.      PHYSICAL EXAM:  Blood pressure 128/76, pulse (!) 101, temperature 98.6 F (37 C), height 5' 10.5 (1.791 m), weight (!) 405 lb (183.7 kg), SpO2 96%. Body mass index is 57.29 kg/m.  DIAGNOSTIC DATA REVIEWED:  BMET    Component Value Date/Time   NA 139 02/26/2024 1042   K 3.3 (L) 02/26/2024 1042   CL 98 02/26/2024 1042   CO2 25 02/26/2024 1042   GLUCOSE 89 02/26/2024 1042   GLUCOSE 122 (H) 11/12/2021 0804   BUN 13 02/26/2024 1042   CREATININE 0.89 02/26/2024 1042   CALCIUM  9.1 02/26/2024 1042   GFRNONAA >60 11/12/2021 0804   GFRAA 90 03/27/2019 1110   Lab Results  Component Value Date   HGBA1C 5.9 (H) 02/26/2024   HGBA1C 6.0 02/04/2021   Lab Results  Component Value Date   INSULIN 25.2 (H) 02/26/2024   Lab Results  Component Value Date   TSH 3.020 02/26/2024   CBC    Component Value Date/Time   WBC 6.7 02/26/2024 1042   WBC 8.4 11/12/2021 0804   RBC 4.40 02/26/2024 1042   RBC 4.24 11/12/2021 0804   HGB 12.8 02/26/2024 1042   HCT 39.5 02/26/2024 1042   PLT 224 02/26/2024 1042   MCV 90 02/26/2024 1042   MCH 29.1 02/26/2024 1042  MCH 29.2 11/12/2021 0804   MCHC 32.4 02/26/2024 1042   MCHC 32.9 11/12/2021 0804   RDW 14.1 02/26/2024 1042   Iron Studies No results found for: IRON, TIBC, FERRITIN, IRONPCTSAT Lipid Panel     Component Value Date/Time   CHOL 198 02/26/2024 1042   TRIG 119 02/26/2024 1042   HDL 73 02/26/2024 1042   CHOLHDL 2.6 10/03/2023 1648   LDLCALC 104 (H) 02/26/2024 1042   Hepatic Function Panel     Component Value Date/Time   PROT 7.4 02/26/2024 1042   ALBUMIN 4.0 02/26/2024 1042   AST 20 02/26/2024 1042   ALT 25 02/26/2024 1042   ALKPHOS 90 02/26/2024 1042   BILITOT 0.4 02/26/2024 1042      Component  Value Date/Time   TSH 3.020 02/26/2024 1042   Nutritional Lab Results  Component Value Date   VD25OH 10.0 (L) 02/26/2024     Assessment and Plan Assessment & Plan Morbid obesity due to excess calories with insulin resistance and prediabetes Morbid obesity with associated insulin resistance and prediabetes. Difficulty adhering to prescribed eating plan. Insulin levels significantly elevated, indicating insulin resistance. A1c improved from 6.1 to 5.9, but still in prediabetic range. Insulin resistance contributing to weight gain and increased hunger, particularly in the evening. - Prescribe metformin 500 mg once daily after the first meal to help with insulin resistance and weight management. - Educate on the importance of protein intake to control insulin levels and manage weight. - Advise against high carbohydrate foods, especially potatoes and certain fruits like bananas, grapes, and cherries. - Encourage consumption of lean proteins and low-impact fruits like berries and watermelon in moderation. - Provide educational materials on insulin resistance and prediabetes.  Essential hypertension Blood pressure well-controlled on current regimen. Concerns about hypokalemia due to hydrochlorothiazide  use. - Increase amlodipine  to 10 mg daily to manage blood pressure. - Discontinue hydrochlorothiazide  to address hypokalemia risk.  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 Risk of cardiac complications if potassium levels drop further. - Discontinue hydrochlorothiazide  to prevent further potassium loss. - Monitor blood pressure to ensure it remains controlled after discontinuation of hydrochlorothiazide .  Vitamin D  deficiency Severe vitamin D  deficiency with levels at 10, significantly below the normal range. Symptoms of fatigue and sluggishness likely related to deficiency. - Prescribe ultra-strength vitamin D  twice  weekly to address deficiency. - Instruct to space doses apart, ideally every three days.  Pure hypercholesterolemia LDL cholesterol slightly above target at 104. Improvement noted from earlier in the year. - Continue current management and monitor cholesterol levels.     I personally spent a total of 45 minutes in the care of the patient today including preparing to see the patient, performing a medically appropriate evaluation of current problems, placing orders in the EMR, documenting clinical information in the EMR, customized nutritional counseling for their specific health and social needs, independently interpreting results, discussing results with the patient and educating them on how these results can affect their health and weight, and explaining the pathophysiology of obesity and how it is significantly more complex than eat less and exercise more.    Jill Stevenson was informed of the importance of frequent follow up visits to maximize her success with intensive lifestyle modifications for her obesity and obesity related health conditions as recommended by USPSTF and CMS guidelines   Louann Penton, MD

## 2024-03-16 ENCOUNTER — Other Ambulatory Visit (INDEPENDENT_AMBULATORY_CARE_PROVIDER_SITE_OTHER): Payer: Self-pay | Admitting: Family Medicine

## 2024-03-26 ENCOUNTER — Ambulatory Visit (INDEPENDENT_AMBULATORY_CARE_PROVIDER_SITE_OTHER): Admitting: Family Medicine

## 2024-03-26 ENCOUNTER — Encounter (INDEPENDENT_AMBULATORY_CARE_PROVIDER_SITE_OTHER): Payer: Self-pay

## 2024-03-31 ENCOUNTER — Encounter: Payer: Self-pay | Admitting: Radiology

## 2024-04-01 DIAGNOSIS — G4730 Sleep apnea, unspecified: Secondary | ICD-10-CM | POA: Diagnosis not present

## 2024-04-01 DIAGNOSIS — G4700 Insomnia, unspecified: Secondary | ICD-10-CM | POA: Diagnosis not present

## 2024-04-01 DIAGNOSIS — I10 Essential (primary) hypertension: Secondary | ICD-10-CM | POA: Diagnosis not present

## 2024-04-07 ENCOUNTER — Encounter (INDEPENDENT_AMBULATORY_CARE_PROVIDER_SITE_OTHER): Payer: Self-pay

## 2024-04-07 ENCOUNTER — Ambulatory Visit (INDEPENDENT_AMBULATORY_CARE_PROVIDER_SITE_OTHER): Payer: Self-pay | Admitting: Family Medicine

## 2024-04-09 ENCOUNTER — Encounter (INDEPENDENT_AMBULATORY_CARE_PROVIDER_SITE_OTHER): Payer: Self-pay

## 2024-04-11 ENCOUNTER — Encounter: Payer: Self-pay | Admitting: Physician Assistant

## 2024-04-11 ENCOUNTER — Ambulatory Visit: Admitting: Physician Assistant

## 2024-04-11 DIAGNOSIS — M170 Bilateral primary osteoarthritis of knee: Secondary | ICD-10-CM

## 2024-04-11 MED ORDER — MELOXICAM 15 MG PO TABS
15.0000 mg | ORAL_TABLET | Freq: Every day | ORAL | 0 refills | Status: AC
Start: 2024-04-11 — End: ?

## 2024-04-11 NOTE — Progress Notes (Signed)
 Office Visit Note   Patient: Jill Stevenson           Date of Birth: July 24, 1980           MRN: [id number] Visit Date: 04/11/2024              Requested by: Celestia Rosaline SQUIBB, NP [address] [place],  [place] [address] PCP: Celestia Rosaline SQUIBB, NP  No chief complaint on file.     HPI: Patient is a pleasant 43 year old woman who comes in today for her knees.  She has a history of osteoarthritis of both of her knees.  She has failed steroid injections have not helped her much.  She cannot get covered for viscosupplementation.  She is in the process of losing weight.  She is wondering if there are some kind of pain management she could do.  Assessment & Plan: Visit Diagnoses:  1. Primary osteoarthritis of both knees     Plan: Will go forward and refer her to chronic pain management.  Will also call her in some meloxicam as this has helped her in the past.  May follow-up with me as needed  Follow-Up Instructions: Return if symptoms worsen or fail to improve.   Ortho Exam  Patient is alert, oriented, no adenopathy, well-dressed, normal affect, normal respiratory effort. Bilateral knees no effusion no erythema compartments are soft and compressible she is neurovascularly intact she has grinding with range of motion of both knees.    Imaging: No results found. No images are attached to the encounter.  Labs: Lab Results  Component Value Date   HGBA1C 5.9 (H) 02/26/2024   HGBA1C 6.1 10/03/2023   HGBA1C 6.0 02/04/2021     Lab Results  Component Value Date   ALBUMIN 4.0 02/26/2024   ALBUMIN 4.0 10/03/2023   ALBUMIN 4.3 02/04/2021    No results found for: MG Lab Results  Component Value Date   VD25OH 10.0 (L) 02/26/2024    No results found for: PREALBUMIN    Latest Ref Rng & Units 02/26/2024   10:42 AM 10/03/2023    4:48 PM 11/12/2021    8:04 AM  CBC EXTENDED  WBC 3.4 - 10.8 x10E3/uL 6.7  9.1  8.4   RBC 3.77 - 5.28 x10E6/uL 4.40  4.81  4.24    Hemoglobin 11.1 - 15.9 g/dL 87.1  86.1  87.5   HCT 34.0 - 46.6 % 39.5  42.1  37.7   Platelets 150 - 450 x10E3/uL 224  227  189   NEUT# 1.4 - 7.0 x10E3/uL 4.2  5.7    Lymph# 0.7 - 3.1 x10E3/uL 2.0  2.7       There is no height or weight on file to calculate BMI.  Orders:  Orders Placed This Encounter  Procedures   Ambulatory referral to Pain Clinic   No orders of the defined types were placed in this encounter.    Procedures: No procedures performed  Clinical Data: No additional findings.  ROS:  All other systems negative, except as noted in the HPI. Review of Systems  Objective: Vital Signs: There were no vitals taken for this visit.  Specialty Comments:  No specialty comments available.  PMFS History: Patient Active Problem List   Diagnosis Date Noted   Acute postoperative pain 12/27/2023   Lump on finger 11/21/2023   Pain in finger of left hand 10/18/2023   Pain of right hand 10/18/2023   Osteoarthritis of knees, bilateral 10/17/2023   Arthralgia  of left knee 10/17/2023   Bilateral knee pain 09/13/2023   Chronic right shoulder pain 01/04/2021   Numbness and tingling in right hand 01/04/2021   Essential hypertension 01/04/2021   Past Medical History:  Diagnosis Date   Anxiety    Arthritis    bilateral knees   Bilateral lower extremity edema    Hypertension    Medical history non-contributory    Obesity    Osteoarthritis    Prediabetes    SOBOE (shortness of breath on exertion)     Family History  Problem Relation Age of Onset   Anxiety disorder Mother    Alcoholism Father    Stroke Father    Hypertension Father    Drug abuse Father    Breast cancer Maternal Aunt 45   Breast cancer Cousin 36    Past Surgical History:  Procedure Laterality Date   CESAREAN SECTION     Social History   Occupational History   Occupation: Astronomer  Tobacco Use   Smoking status: Former    Current packs/day: 0.00    Types: Cigarettes    Quit  date: 07/27/2021    Years since quitting: 2.7   Smokeless tobacco: Never  Vaping Use   Vaping status: Every Day  Substance and Sexual Activity   Alcohol use: Yes    Comment: occasionally   Drug use: No   Sexual activity: Yes    Birth control/protection: Inserts

## 2024-04-17 DIAGNOSIS — R030 Elevated blood-pressure reading, without diagnosis of hypertension: Secondary | ICD-10-CM | POA: Diagnosis not present

## 2024-04-17 DIAGNOSIS — Z1159 Encounter for screening for other viral diseases: Secondary | ICD-10-CM | POA: Diagnosis not present

## 2024-04-17 DIAGNOSIS — E7800 Pure hypercholesterolemia, unspecified: Secondary | ICD-10-CM | POA: Diagnosis not present

## 2024-04-17 DIAGNOSIS — Z131 Encounter for screening for diabetes mellitus: Secondary | ICD-10-CM | POA: Diagnosis not present

## 2024-04-17 DIAGNOSIS — Z6841 Body Mass Index (BMI) 40.0 and over, adult: Secondary | ICD-10-CM | POA: Diagnosis not present

## 2024-04-17 DIAGNOSIS — R5383 Other fatigue: Secondary | ICD-10-CM | POA: Diagnosis not present

## 2024-04-17 DIAGNOSIS — E559 Vitamin D deficiency, unspecified: Secondary | ICD-10-CM | POA: Diagnosis not present

## 2024-04-17 DIAGNOSIS — M129 Arthropathy, unspecified: Secondary | ICD-10-CM | POA: Diagnosis not present

## 2024-04-17 DIAGNOSIS — Z79899 Other long term (current) drug therapy: Secondary | ICD-10-CM | POA: Diagnosis not present

## 2024-04-22 ENCOUNTER — Other Ambulatory Visit (INDEPENDENT_AMBULATORY_CARE_PROVIDER_SITE_OTHER): Payer: Self-pay | Admitting: Family Medicine

## 2024-04-22 DIAGNOSIS — R030 Elevated blood-pressure reading, without diagnosis of hypertension: Secondary | ICD-10-CM | POA: Diagnosis not present

## 2024-04-22 DIAGNOSIS — Z6841 Body Mass Index (BMI) 40.0 and over, adult: Secondary | ICD-10-CM | POA: Diagnosis not present

## 2024-04-22 DIAGNOSIS — M25562 Pain in left knee: Secondary | ICD-10-CM | POA: Diagnosis not present

## 2024-04-22 DIAGNOSIS — Z79899 Other long term (current) drug therapy: Secondary | ICD-10-CM | POA: Diagnosis not present

## 2024-04-22 DIAGNOSIS — M542 Cervicalgia: Secondary | ICD-10-CM | POA: Diagnosis not present

## 2024-04-22 DIAGNOSIS — M25561 Pain in right knee: Secondary | ICD-10-CM | POA: Diagnosis not present

## 2024-04-30 DIAGNOSIS — G4730 Sleep apnea, unspecified: Secondary | ICD-10-CM | POA: Diagnosis not present

## 2024-04-30 DIAGNOSIS — I119 Hypertensive heart disease without heart failure: Secondary | ICD-10-CM | POA: Diagnosis not present

## 2024-04-30 DIAGNOSIS — E66813 Obesity, class 3: Secondary | ICD-10-CM | POA: Diagnosis not present

## 2024-04-30 DIAGNOSIS — M25562 Pain in left knee: Secondary | ICD-10-CM | POA: Diagnosis not present

## 2024-04-30 DIAGNOSIS — R7303 Prediabetes: Secondary | ICD-10-CM | POA: Diagnosis not present

## 2024-04-30 DIAGNOSIS — Z6841 Body Mass Index (BMI) 40.0 and over, adult: Secondary | ICD-10-CM | POA: Diagnosis not present

## 2024-05-08 ENCOUNTER — Encounter: Payer: Self-pay | Admitting: Obstetrics & Gynecology

## 2024-05-12 DIAGNOSIS — G4733 Obstructive sleep apnea (adult) (pediatric): Secondary | ICD-10-CM | POA: Diagnosis not present

## 2024-05-15 ENCOUNTER — Other Ambulatory Visit (INDEPENDENT_AMBULATORY_CARE_PROVIDER_SITE_OTHER): Payer: Self-pay | Admitting: Family Medicine

## 2024-05-15 ENCOUNTER — Other Ambulatory Visit: Payer: Self-pay | Admitting: Physician Assistant

## 2024-05-20 DIAGNOSIS — M25561 Pain in right knee: Secondary | ICD-10-CM | POA: Diagnosis not present

## 2024-05-20 DIAGNOSIS — R030 Elevated blood-pressure reading, without diagnosis of hypertension: Secondary | ICD-10-CM | POA: Diagnosis not present

## 2024-05-20 DIAGNOSIS — Z6841 Body Mass Index (BMI) 40.0 and over, adult: Secondary | ICD-10-CM | POA: Diagnosis not present

## 2024-05-20 DIAGNOSIS — M25562 Pain in left knee: Secondary | ICD-10-CM | POA: Diagnosis not present

## 2024-05-20 DIAGNOSIS — Z79899 Other long term (current) drug therapy: Secondary | ICD-10-CM | POA: Diagnosis not present

## 2024-05-21 DIAGNOSIS — I10 Essential (primary) hypertension: Secondary | ICD-10-CM | POA: Diagnosis not present

## 2024-05-21 DIAGNOSIS — G4733 Obstructive sleep apnea (adult) (pediatric): Secondary | ICD-10-CM | POA: Diagnosis not present

## 2024-06-17 ENCOUNTER — Other Ambulatory Visit: Payer: Self-pay | Admitting: Physician Assistant

## 2024-06-18 ENCOUNTER — Other Ambulatory Visit (HOSPITAL_COMMUNITY)
Admission: RE | Admit: 2024-06-18 | Discharge: 2024-06-18 | Disposition: A | Source: Ambulatory Visit | Attending: Obstetrics & Gynecology | Admitting: Obstetrics & Gynecology

## 2024-06-18 ENCOUNTER — Ambulatory Visit: Admitting: Obstetrics & Gynecology

## 2024-06-18 ENCOUNTER — Encounter: Payer: Self-pay | Admitting: Obstetrics & Gynecology

## 2024-06-18 VITALS — BP 134/84 | HR 94 | Ht 71.0 in | Wt >= 6400 oz

## 2024-06-18 DIAGNOSIS — N3941 Urge incontinence: Secondary | ICD-10-CM

## 2024-06-18 DIAGNOSIS — Z113 Encounter for screening for infections with a predominantly sexual mode of transmission: Secondary | ICD-10-CM

## 2024-06-18 DIAGNOSIS — Z01419 Encounter for gynecological examination (general) (routine) without abnormal findings: Secondary | ICD-10-CM | POA: Diagnosis present

## 2024-06-18 LAB — POCT URINALYSIS DIPSTICK
Blood, UA: POSITIVE
Glucose, UA: NEGATIVE
Ketones, UA: NEGATIVE
Nitrite, UA: NEGATIVE
Protein, UA: POSITIVE — AB
Spec Grav, UA: 1.015
Urobilinogen, UA: 0.2 U/dL
pH, UA: 7

## 2024-06-18 MED ORDER — TOLTERODINE TARTRATE ER 4 MG PO CP24: 4.0000 mg | ORAL_CAPSULE | Freq: Every day | ORAL | 2 refills | Status: AC

## 2024-06-18 NOTE — Progress Notes (Signed)
 GYNECOLOGY CLINIC ANNUAL PREVENTATIVE CARE ENCOUNTER NOTE  Subjective:   Jill Stevenson is a 44 y.o. G28P2002 female here for a routine annual gynecologic exam.  Current complaints: Knee pain , urnary urge and incontinence for 1 year.   Denies abnormal vaginal bleeding, discharge, pelvic pain, problems with intercourse or    Gynecologic History Patient's last menstrual period was 06/11/2024 (exact date). Contraception: abstinence Last Pap: 2020. Results were: normal Last mammogram: 2025. Results were: abnormal  Obstetric History OB History  Gravida Para Term Preterm AB Living  3 2 2   2   SAB IAB Ectopic Multiple Live Births          # Outcome Date GA Lbr Len/2nd Weight Sex Type Anes PTL Lv  3 Gravida           2 Term      CS-Unspec     1 Term      CS-Unspec       Past Medical History:  Diagnosis Date   Anemia    Anxiety    Arthritis    bilateral knees   Bilateral lower extremity edema    Depression    Dyspnea    Hypertension    Medical history non-contributory    Obesity    Osteoarthritis    Prediabetes    Sleep apnea    SOBOE (shortness of breath on exertion)     Past Surgical History:  Procedure Laterality Date   CESAREAN SECTION      Medications Ordered Prior to Encounter[1]  Allergies[2]  Social History   Socioeconomic History   Marital status: Single    Spouse name: Not on file   Number of children: 2   Years of education: Not on file   Highest education level: Some college, no degree  Occupational History   Occupation: Astronomer  Tobacco Use   Smoking status: Former    Current packs/day: 0.00    Average packs/day: 0.3 packs/day    Types: Cigarettes    Quit date: 07/27/2021    Years since quitting: 2.8   Smokeless tobacco: Current  Vaping Use   Vaping status: Every Day  Substance and Sexual Activity   Alcohol use: Yes    Comment: occasionally   Drug use: No   Sexual activity: Yes    Birth control/protection: None  Other Topics  Concern   Not on file  Social History Narrative   Not on file   Social Drivers of Health   Tobacco Use: High Risk (06/18/2024)   Patient History    Smoking Tobacco Use: Former    Smokeless Tobacco Use: Current    Passive Exposure: Not on Actuary Strain: High Risk (01/04/2024)   Overall Financial Resource Strain (CARDIA)    Difficulty of Paying Living Expenses: Very hard  Food Insecurity: Food Insecurity Present (01/04/2024)   Epic    Worried About Programme Researcher, Broadcasting/film/video in the Last Year: Often true    Ran Out of Food in the Last Year: Patient declined  Transportation Needs: No Transportation Needs (01/04/2024)   Epic    Lack of Transportation (Medical): No    Lack of Transportation (Non-Medical): No  Physical Activity: Insufficiently Active (01/04/2024)   Exercise Vital Sign    Days of Exercise per Week: 2 days    Minutes of Exercise per Session: 40 min  Stress: Stress Concern Present (01/04/2024)   [employer] of Occupational Health - Occupational Stress Questionnaire    Feeling of Stress:  To some extent  Social Connections: Socially Isolated (01/04/2024)   Social Connection and Isolation Panel    Frequency of Communication with Friends and Family: Twice a week    Frequency of Social Gatherings with Friends and Family: Never    Attends Religious Services: Never    Database Administrator or Organizations: No    Attends Engineer, Structural: Not on file    Marital Status: Living with partner  Intimate Partner Violence: Not on file  Depression (PHQ2-9): High Risk (06/18/2024)   Depression (PHQ2-9)    PHQ-2 Score: 17  Alcohol Screen: Low Risk (01/04/2024)   Alcohol Screen    Last Alcohol Screening Score (AUDIT): 4  Housing: Low Risk (01/04/2024)   Epic    Unable to Pay for Housing in the Last Year: No    Number of Times Moved in the Last Year: 1    Homeless in the Last Year: No  Utilities: Not on file  Health Literacy: Not on file    Family History   Problem Relation Age of Onset   Anxiety disorder Mother    Alcoholism Father    Stroke Father    Hypertension Father    Drug abuse Father    Breast cancer Maternal Aunt 47   Breast cancer Cousin 75    The following portions of the patient's history were reviewed and updated as appropriate: allergies, current medications, past family history, past medical history, past social history, past surgical history and problem list.  Review of Systems Constitutional: negative Respiratory: negative Cardiovascular: negative Gastrointestinal: negative Genitourinary:urinary frequency urge and incontinence Musculoskeletal:bilateral knee pain   Objective:  BP 134/84   Pulse 94   Ht 5' 11 (1.803 m)   Wt (!) 421 lb (191 kg)   LMP 06/11/2024 (Exact Date)   BMI 58.72 kg/m  CONSTITUTIONAL: Well-developed, well-nourished female in no acute distress.  HENT:  Normocephalic, atraumatic, External right and left ear normal. Oropharynx is clear and moist EYES: Conjunctivae and EOM are normal. Pupils are equal, round, and reactive to light. No scleral icterus.  NECK: Normal range of motion, supple, no masses.  Normal thyroid .  SKIN: Skin is warm and dry. No rash noted. Not diaphoretic. No erythema. No pallor. NEUROLGIC: Alert and oriented to person, place, and time. Normal reflexes, muscle tone coordination. No cranial nerve deficit noted. PSYCHIATRIC: Normal mood and affect. Normal behavior. Normal judgment and thought content. CARDIOVASCULAR: Normal heart rate noted, regular rhythm RESPIRATORY: Clear to auscultation bilaterally. Effort and breath sounds normal, no problems with respiration noted. BREASTS: Symmetric in size. No masses, skin changes, nipple drainage, or lymphadenopathy. ABDOMEN: Soft, normal bowel sounds, no distention noted.  No tenderness, rebound or guarding.  PELVIC: Normal appearing external genitalia; normal appearing vaginal mucosa and cervix.  No abnormal discharge noted.  Pap  smear obtained.  Normal uterine size, no other palpable masses, no uterine or adnexal tenderness. MUSCULOSKELETAL: Normal range of motion. No tenderness.  No cyanosis, clubbing, or edema.    Assessment:  Annual gynecologic examination with pap smear  Well woman exam with routine gynecological exam - Plan: Cytology - PAP( [place]), Cervicovaginal ancillary only( [place]), HIV antibody (with reflex), Hepatitis C Antibody, Hepatitis B Surface AntiGEN, RPR, Ambulatory referral to [facility], MM 3D SCREENING MAMMOGRAM BILATERAL BREAST, CANCELED: MM DIGITAL SCREENING BILATERAL  Screen for STD (sexually transmitted disease)  Urge incontinence of urine - Plan: Urine Culture, tolterodine  (DETROL  LA) 4 MG 24 hr capsule  Plan:  Will follow  up results of pap smear and manage accordingly. Mammogram scheduled 6 mo Urine culture as part of evaluation for urge incontinence Meds ordered this encounter  Medications   tolterodine  (DETROL  LA) 4 MG 24 hr capsule    Sig: Take 1 capsule (4 mg total) by mouth daily.    Dispense:  90 capsule    Refill:  2   RTC to review her sx Routine preventative health maintenance measures emphasized. Please refer to After Visit Summary for other counseling recommendations.    LYNWOOD SOLOMONS, MD Attending Obstetrician & Gynecologist Center for [facility], [facility]    [1]  Current Outpatient Medications on File Prior to Visit  Medication Sig Dispense Refill   HYDROcodone -acetaminophen  (NORCO) 10-325 MG tablet Take 1 tablet by mouth 4 (four) times daily as needed.     amLODipine  (NORVASC ) 10 MG tablet Take 1 tablet (10 mg total) by mouth daily. 90 tablet 0   buPROPion (WELLBUTRIN XL) 150 MG 24 hr tablet Take 150 mg by mouth daily.     hydrochlorothiazide  (HYDRODIURIL ) 25 MG tablet Take 1 tablet (25 mg total) by mouth daily. 90 tablet 1   meloxicam  (MOBIC ) 15 MG tablet TAKE 1 TABLET(15 MG) BY MOUTH DAILY 30 tablet 0    metFORMIN  (GLUCOPHAGE ) 500 MG tablet Take 1 tablet (500 mg total) by mouth daily with breakfast. 30 tablet 0   Vitamin D , Ergocalciferol , (DRISDOL ) 1.25 MG (50000 UNIT) CAPS capsule Take 1 capsule (50,000 Units total) by mouth every 3 (three) days. 9 capsule 0   WEGOVY 0.5 MG/0.5ML SOAJ ADMINISTER 0.5 MG UNDER THE SKIN WEEKLY (Patient not taking: Reported on 03/11/2024)     No current facility-administered medications on file prior to visit.  [2] No Known Allergies

## 2024-06-18 NOTE — Progress Notes (Addendum)
 44 y.o. New GYN presents for AEX/PAP/STD screening.  Pt wants to know about cyst on her ovary.  Last Mammogram/ US  12/20/2023 Negative  PHQ-9=17  //  GAD-7=8 Referral sent

## 2024-06-19 ENCOUNTER — Ambulatory Visit: Payer: Self-pay | Admitting: Obstetrics & Gynecology

## 2024-06-19 DIAGNOSIS — A599 Trichomoniasis, unspecified: Secondary | ICD-10-CM

## 2024-06-19 DIAGNOSIS — B9689 Other specified bacterial agents as the cause of diseases classified elsewhere: Secondary | ICD-10-CM

## 2024-06-19 LAB — HEPATITIS B SURFACE ANTIGEN: Hepatitis B Surface Ag: NEGATIVE

## 2024-06-19 LAB — CERVICOVAGINAL ANCILLARY ONLY
Bacterial Vaginitis (gardnerella): POSITIVE — AB
Candida Glabrata: NEGATIVE
Candida Vaginitis: NEGATIVE
Chlamydia: NEGATIVE
Comment: NEGATIVE
Comment: NEGATIVE
Comment: NEGATIVE
Comment: NEGATIVE
Comment: NEGATIVE
Comment: NORMAL
Neisseria Gonorrhea: NEGATIVE
Trichomonas: POSITIVE — AB

## 2024-06-19 LAB — SYPHILIS: RPR W/REFLEX TO RPR TITER AND TREPONEMAL ANTIBODIES, TRADITIONAL SCREENING AND DIAGNOSIS ALGORITHM: RPR Ser Ql: NONREACTIVE

## 2024-06-19 LAB — HIV ANTIBODY (ROUTINE TESTING W REFLEX): HIV Screen 4th Generation wRfx: NONREACTIVE

## 2024-06-19 LAB — HEPATITIS C ANTIBODY: Hep C Virus Ab: NONREACTIVE

## 2024-06-19 MED ORDER — METRONIDAZOLE 500 MG PO TABS: 500.0000 mg | ORAL_TABLET | Freq: Two times a day (BID) | ORAL | 0 refills | Status: AC

## 2024-06-20 LAB — CYTOLOGY - PAP
Adequacy: ABSENT
Comment: NEGATIVE
Diagnosis: NEGATIVE
High risk HPV: NEGATIVE

## 2024-06-20 LAB — URINE CULTURE

## 2024-06-26 ENCOUNTER — Encounter: Payer: Self-pay | Admitting: Licensed Clinical Social Worker

## 2024-12-22 ENCOUNTER — Ambulatory Visit
